# Patient Record
Sex: Female | Born: 1965 | Race: Black or African American | Hispanic: No | Marital: Married | Smoking: Former smoker
Health system: Southern US, Community
[De-identification: ages and names within clinical notes are randomized; demographics above are authoritative.]

## PROBLEM LIST (undated history)

## (undated) DIAGNOSIS — G4730 Sleep apnea, unspecified: Secondary | ICD-10-CM

## (undated) DIAGNOSIS — I214 Non-ST elevation (NSTEMI) myocardial infarction: Secondary | ICD-10-CM

## (undated) DIAGNOSIS — F419 Anxiety disorder, unspecified: Secondary | ICD-10-CM

## (undated) DIAGNOSIS — E7800 Pure hypercholesterolemia, unspecified: Secondary | ICD-10-CM

## (undated) DIAGNOSIS — I2699 Other pulmonary embolism without acute cor pulmonale: Secondary | ICD-10-CM

## (undated) DIAGNOSIS — F32A Depression, unspecified: Secondary | ICD-10-CM

## (undated) DIAGNOSIS — I10 Essential (primary) hypertension: Secondary | ICD-10-CM

## (undated) HISTORY — PX: TOTAL KNEE ARTHROPLASTY: SHX125

## (undated) HISTORY — PX: TUBAL LIGATION: SHX77

## (undated) HISTORY — PX: GASTRIC BYPASS: SHX52

---

## 1999-09-27 DIAGNOSIS — I2699 Other pulmonary embolism without acute cor pulmonale: Secondary | ICD-10-CM

## 1999-09-27 HISTORY — DX: Other pulmonary embolism without acute cor pulmonale: I26.99

## 2004-04-07 ENCOUNTER — Other Ambulatory Visit: Payer: Self-pay

## 2004-09-07 ENCOUNTER — Emergency Department: Payer: Self-pay | Admitting: Emergency Medicine

## 2005-04-26 ENCOUNTER — Emergency Department: Payer: Self-pay | Admitting: Emergency Medicine

## 2005-04-26 ENCOUNTER — Other Ambulatory Visit: Payer: Self-pay

## 2005-05-10 ENCOUNTER — Emergency Department: Payer: Self-pay | Admitting: Emergency Medicine

## 2005-11-20 ENCOUNTER — Emergency Department: Payer: Self-pay | Admitting: Emergency Medicine

## 2006-05-05 ENCOUNTER — Other Ambulatory Visit: Payer: Self-pay

## 2006-05-05 ENCOUNTER — Emergency Department: Payer: Self-pay | Admitting: Emergency Medicine

## 2006-08-02 ENCOUNTER — Emergency Department: Payer: Self-pay | Admitting: Emergency Medicine

## 2006-08-24 ENCOUNTER — Emergency Department: Payer: Self-pay | Admitting: Emergency Medicine

## 2007-05-09 ENCOUNTER — Emergency Department: Payer: Self-pay | Admitting: Emergency Medicine

## 2007-11-19 ENCOUNTER — Emergency Department: Payer: Self-pay | Admitting: Internal Medicine

## 2007-11-29 ENCOUNTER — Emergency Department: Payer: Self-pay | Admitting: Emergency Medicine

## 2008-01-05 ENCOUNTER — Emergency Department: Payer: Self-pay | Admitting: Emergency Medicine

## 2008-01-05 ENCOUNTER — Other Ambulatory Visit: Payer: Self-pay

## 2008-01-07 ENCOUNTER — Emergency Department (HOSPITAL_COMMUNITY): Admission: EM | Admit: 2008-01-07 | Discharge: 2008-01-08 | Payer: Self-pay | Admitting: Emergency Medicine

## 2008-01-26 ENCOUNTER — Emergency Department: Payer: Self-pay | Admitting: Emergency Medicine

## 2008-10-15 ENCOUNTER — Ambulatory Visit: Payer: Self-pay

## 2008-11-18 ENCOUNTER — Ambulatory Visit: Payer: Self-pay

## 2008-12-27 ENCOUNTER — Emergency Department: Payer: Self-pay | Admitting: Emergency Medicine

## 2009-01-02 ENCOUNTER — Emergency Department: Payer: Self-pay | Admitting: Emergency Medicine

## 2009-07-07 ENCOUNTER — Inpatient Hospital Stay: Payer: Self-pay | Admitting: Internal Medicine

## 2009-10-09 ENCOUNTER — Emergency Department: Payer: Self-pay | Admitting: Emergency Medicine

## 2009-10-11 ENCOUNTER — Emergency Department: Payer: Self-pay | Admitting: Emergency Medicine

## 2009-10-13 ENCOUNTER — Emergency Department: Payer: Self-pay | Admitting: Emergency Medicine

## 2010-02-16 ENCOUNTER — Emergency Department: Payer: Self-pay | Admitting: Emergency Medicine

## 2010-02-19 ENCOUNTER — Emergency Department: Payer: Self-pay | Admitting: Emergency Medicine

## 2010-06-09 ENCOUNTER — Emergency Department: Payer: Self-pay | Admitting: Emergency Medicine

## 2010-07-31 ENCOUNTER — Emergency Department: Payer: Self-pay | Admitting: Emergency Medicine

## 2010-08-10 ENCOUNTER — Emergency Department: Payer: Self-pay | Admitting: Emergency Medicine

## 2010-11-23 ENCOUNTER — Emergency Department: Payer: Self-pay

## 2010-12-10 ENCOUNTER — Emergency Department: Payer: Self-pay | Admitting: Emergency Medicine

## 2010-12-19 ENCOUNTER — Emergency Department: Payer: Self-pay | Admitting: Emergency Medicine

## 2011-01-20 ENCOUNTER — Emergency Department: Payer: Self-pay | Admitting: Internal Medicine

## 2011-03-07 ENCOUNTER — Emergency Department: Payer: Self-pay | Admitting: Emergency Medicine

## 2011-06-26 ENCOUNTER — Emergency Department: Payer: Self-pay | Admitting: Internal Medicine

## 2011-11-11 ENCOUNTER — Emergency Department: Payer: Self-pay | Admitting: Emergency Medicine

## 2011-11-11 LAB — RAPID INFLUENZA A&B ANTIGENS

## 2011-11-22 ENCOUNTER — Encounter: Payer: Self-pay | Admitting: *Deleted

## 2011-12-01 ENCOUNTER — Emergency Department: Payer: Self-pay | Admitting: Emergency Medicine

## 2011-12-03 ENCOUNTER — Emergency Department: Payer: Self-pay | Admitting: Emergency Medicine

## 2011-12-04 ENCOUNTER — Emergency Department: Payer: Self-pay | Admitting: Emergency Medicine

## 2012-01-09 ENCOUNTER — Ambulatory Visit: Payer: Self-pay | Admitting: Surgery

## 2012-01-09 LAB — BASIC METABOLIC PANEL
Anion Gap: 9 (ref 7–16)
Calcium, Total: 8.8 mg/dL (ref 8.5–10.1)
Creatinine: 0.92 mg/dL (ref 0.60–1.30)
EGFR (African American): >60
Glucose: 97 mg/dL (ref 65–99)
Osmolality: 283 (ref 275–301)
Potassium: 3.9 mmol/L (ref 3.5–5.1)
Sodium: 141 mmol/L (ref 136–145)

## 2012-01-09 LAB — CBC WITH DIFFERENTIAL/PLATELET
Basophil #: 0 10*3/uL (ref 0.0–0.1)
Eosinophil %: 3.4 %
HGB: 11.2 g/dL — ABNORMAL LOW (ref 12.0–16.0)
MCHC: 32.7 g/dL (ref 32.0–36.0)
MCV: 84 fL (ref 80–100)
Platelet: 273 10*3/uL (ref 150–440)
RDW: 15.7 % — ABNORMAL HIGH (ref 11.5–14.5)
WBC: 7.9 10*3/uL (ref 3.6–11.0)

## 2012-01-09 LAB — PREGNANCY, URINE: Pregnancy Test, Urine: NEGATIVE m[IU]/mL

## 2012-04-23 ENCOUNTER — Emergency Department: Payer: Self-pay | Admitting: Emergency Medicine

## 2012-04-24 ENCOUNTER — Emergency Department: Payer: Self-pay | Admitting: Emergency Medicine

## 2012-10-12 ENCOUNTER — Emergency Department: Payer: Self-pay | Admitting: Emergency Medicine

## 2013-03-31 ENCOUNTER — Emergency Department: Payer: Self-pay | Admitting: Emergency Medicine

## 2013-07-01 ENCOUNTER — Ambulatory Visit: Payer: Self-pay | Admitting: Specialist

## 2013-07-01 LAB — FOLATE: Folic Acid: 10.6 ng/mL (ref 3.1–100.0)

## 2013-07-01 LAB — COMPREHENSIVE METABOLIC PANEL
Albumin: 3.2 g/dL — ABNORMAL LOW (ref 3.4–5.0)
BUN: 15 mg/dL (ref 7–18)
Bilirubin,Total: 0.3 mg/dL (ref 0.2–1.0)
Calcium, Total: 9 mg/dL (ref 8.5–10.1)
Chloride: 101 mmol/L (ref 98–107)
Creatinine: 1.14 mg/dL (ref 0.60–1.30)
EGFR (African American): >60
Glucose: 118 mg/dL — ABNORMAL HIGH (ref 65–99)
Osmolality: 270 (ref 275–301)
SGOT(AST): 19 U/L (ref 15–37)
Sodium: 134 mmol/L — ABNORMAL LOW (ref 136–145)

## 2013-07-01 LAB — CBC WITH DIFFERENTIAL/PLATELET
Basophil #: 0.1 10*3/uL (ref 0.0–0.1)
Basophil %: 0.7 %
Eosinophil %: 2.9 %
HCT: 37.4 % (ref 35.0–47.0)
Lymphocyte #: 3 10*3/uL (ref 1.0–3.6)
Lymphocyte %: 28.8 %
MCH: 27.6 pg (ref 26.0–34.0)
MCHC: 33.7 g/dL (ref 32.0–36.0)
MCV: 82 fL (ref 80–100)
Neutrophil #: 6.7 10*3/uL — ABNORMAL HIGH (ref 1.4–6.5)
Neutrophil %: 64 %
RDW: 15 % — ABNORMAL HIGH (ref 11.5–14.5)

## 2013-07-01 LAB — HEMOGLOBIN A1C: Hemoglobin A1C: 7 % — ABNORMAL HIGH (ref 4.2–6.3)

## 2013-07-01 LAB — PROTIME-INR
INR: 0.9
Prothrombin Time: 12.4 secs (ref 11.5–14.7)

## 2013-07-01 LAB — PHOSPHORUS: Phosphorus: 3.2 mg/dL (ref 2.5–4.9)

## 2013-07-01 LAB — MAGNESIUM: Magnesium: 1.6 mg/dL — ABNORMAL LOW

## 2013-07-01 LAB — APTT: Activated PTT: 31.2 secs (ref 23.6–35.9)

## 2013-07-01 LAB — TSH: Thyroid Stimulating Horm: 1.6 u[IU]/mL

## 2013-07-01 LAB — LIPASE, BLOOD: Lipase: 116 U/L (ref 73–393)

## 2013-07-18 ENCOUNTER — Ambulatory Visit: Payer: Self-pay | Admitting: Specialist

## 2013-07-30 ENCOUNTER — Ambulatory Visit: Payer: Self-pay | Admitting: Cardiology

## 2013-07-31 ENCOUNTER — Ambulatory Visit: Payer: Self-pay | Admitting: Specialist

## 2013-08-29 ENCOUNTER — Emergency Department: Payer: Self-pay | Admitting: Internal Medicine

## 2013-09-25 ENCOUNTER — Emergency Department: Payer: Self-pay | Admitting: Emergency Medicine

## 2013-09-25 LAB — BASIC METABOLIC PANEL
BUN: 15 mg/dL (ref 7–18)
Calcium, Total: 9.4 mg/dL (ref 8.5–10.1)
Chloride: 100 mmol/L (ref 98–107)
Creatinine: 1.14 mg/dL (ref 0.60–1.30)
Osmolality: 269 (ref 275–301)
Sodium: 134 mmol/L — ABNORMAL LOW (ref 136–145)

## 2013-09-25 LAB — URIC ACID: Uric Acid: 7.2 mg/dL — ABNORMAL HIGH (ref 2.6–6.0)

## 2013-09-25 LAB — CBC WITH DIFFERENTIAL/PLATELET
Eosinophil #: 0.2 10*3/uL (ref 0.0–0.7)
Eosinophil %: 2.1 %
Lymphocyte %: 19.6 %
MCH: 27.2 pg (ref 26.0–34.0)
Neutrophil #: 8.2 10*3/uL — ABNORMAL HIGH (ref 1.4–6.5)
Neutrophil %: 73.5 %
Platelet: 342 10*3/uL (ref 150–440)
WBC: 11.2 10*3/uL — ABNORMAL HIGH (ref 3.6–11.0)

## 2013-10-04 ENCOUNTER — Ambulatory Visit: Payer: Self-pay | Admitting: Podiatry

## 2013-11-05 ENCOUNTER — Ambulatory Visit: Payer: Self-pay | Admitting: Specialist

## 2013-12-03 ENCOUNTER — Ambulatory Visit: Payer: Self-pay | Admitting: Specialist

## 2014-03-20 ENCOUNTER — Emergency Department: Payer: Self-pay | Admitting: Emergency Medicine

## 2014-08-04 ENCOUNTER — Emergency Department: Payer: Self-pay | Admitting: Emergency Medicine

## 2014-08-04 LAB — COMPREHENSIVE METABOLIC PANEL
ALK PHOS: 62 U/L
ANION GAP: 6 — AB (ref 7–16)
AST: 12 U/L — AB (ref 15–37)
Albumin: 3 g/dL — ABNORMAL LOW (ref 3.4–5.0)
BUN: 10 mg/dL (ref 7–18)
Bilirubin,Total: 0.4 mg/dL (ref 0.2–1.0)
CALCIUM: 8.5 mg/dL (ref 8.5–10.1)
CHLORIDE: 101 mmol/L (ref 98–107)
CO2: 32 mmol/L (ref 21–32)
Creatinine: 1.17 mg/dL (ref 0.60–1.30)
GFR CALC NON AF AMER: 52 — AB
GLUCOSE: 93 mg/dL (ref 65–99)
Osmolality: 276 (ref 275–301)
POTASSIUM: 3.3 mmol/L — AB (ref 3.5–5.1)
SGPT (ALT): 24 U/L
Sodium: 139 mmol/L (ref 136–145)
Total Protein: 7.4 g/dL (ref 6.4–8.2)

## 2014-08-04 LAB — CBC
HCT: 37.7 % (ref 35.0–47.0)
HGB: 12.5 g/dL (ref 12.0–16.0)
MCH: 27.8 pg (ref 26.0–34.0)
MCHC: 33 g/dL (ref 32.0–36.0)
MCV: 84 fL (ref 80–100)
Platelet: 254 10*3/uL (ref 150–440)
RBC: 4.47 10*6/uL (ref 3.80–5.20)
RDW: 16.2 % — AB (ref 11.5–14.5)
WBC: 9.5 10*3/uL (ref 3.6–11.0)

## 2014-08-04 LAB — TROPONIN I

## 2014-08-08 ENCOUNTER — Emergency Department: Payer: Self-pay | Admitting: Emergency Medicine

## 2014-08-08 LAB — CBC WITH DIFFERENTIAL/PLATELET
BASOS PCT: 0.2 %
Basophil #: 0 10*3/uL (ref 0.0–0.1)
EOS ABS: 0.2 10*3/uL (ref 0.0–0.7)
EOS PCT: 1.9 %
HCT: 41.4 % (ref 35.0–47.0)
HGB: 13.6 g/dL (ref 12.0–16.0)
LYMPHS PCT: 23.8 %
Lymphocyte #: 2.3 10*3/uL (ref 1.0–3.6)
MCH: 27.7 pg (ref 26.0–34.0)
MCHC: 32.8 g/dL (ref 32.0–36.0)
MCV: 84 fL (ref 80–100)
MONO ABS: 0.5 x10 3/mm (ref 0.2–0.9)
Monocyte %: 5.2 %
NEUTROS ABS: 6.8 10*3/uL — AB (ref 1.4–6.5)
Neutrophil %: 68.9 %
PLATELETS: 319 10*3/uL (ref 150–440)
RBC: 4.9 10*6/uL (ref 3.80–5.20)
RDW: 16.3 % — ABNORMAL HIGH (ref 11.5–14.5)
WBC: 9.9 10*3/uL (ref 3.6–11.0)

## 2014-08-08 LAB — COMPREHENSIVE METABOLIC PANEL
ALBUMIN: 3 g/dL — AB (ref 3.4–5.0)
ANION GAP: 10 (ref 7–16)
AST: 21 U/L (ref 15–37)
Alkaline Phosphatase: 78 U/L
BILIRUBIN TOTAL: 0.4 mg/dL (ref 0.2–1.0)
BUN: 8 mg/dL (ref 7–18)
Calcium, Total: 8.1 mg/dL — ABNORMAL LOW (ref 8.5–10.1)
Chloride: 99 mmol/L (ref 98–107)
Co2: 31 mmol/L (ref 21–32)
Creatinine: 1.05 mg/dL (ref 0.60–1.30)
GFR CALC NON AF AMER: 59 — AB
Glucose: 94 mg/dL (ref 65–99)
Osmolality: 277 (ref 275–301)
Potassium: 3.1 mmol/L — ABNORMAL LOW (ref 3.5–5.1)
SGPT (ALT): 22 U/L
SODIUM: 140 mmol/L (ref 136–145)
Total Protein: 8.1 g/dL (ref 6.4–8.2)

## 2014-08-08 LAB — LIPASE, BLOOD: LIPASE: 121 U/L (ref 73–393)

## 2014-08-08 LAB — TROPONIN I: Troponin-I: 0.02 ng/mL

## 2014-08-09 LAB — URINALYSIS, COMPLETE
Bilirubin,UR: NEGATIVE
Glucose,UR: NEGATIVE mg/dL (ref 0–75)
Ketone: NEGATIVE
NITRITE: NEGATIVE
PH: 5 (ref 4.5–8.0)
Protein: NEGATIVE
RBC,UR: 25 /HPF (ref 0–5)
Specific Gravity: 1.014 (ref 1.003–1.030)
WBC UR: 22 /HPF (ref 0–5)

## 2014-11-25 ENCOUNTER — Emergency Department: Payer: Self-pay | Admitting: Emergency Medicine

## 2014-12-11 ENCOUNTER — Emergency Department: Payer: Self-pay | Admitting: Emergency Medicine

## 2015-01-22 ENCOUNTER — Emergency Department
Admit: 2015-01-22 | Disposition: A | Discharge status: Home / Self Care | Payer: Self-pay | Admitting: Emergency Medicine

## 2015-01-22 LAB — COMPREHENSIVE METABOLIC PANEL
ALBUMIN: 3.1 g/dL — AB
ALK PHOS: 43 U/L
ALT: 9 U/L — AB
AST: 12 U/L — AB
Anion Gap: 8 (ref 7–16)
BUN: 15 mg/dL
Bilirubin,Total: 0.4 mg/dL
CALCIUM: 8.1 mg/dL — AB
Chloride: 102 mmol/L
Co2: 28 mmol/L
Creatinine: 1.01 mg/dL — ABNORMAL HIGH
EGFR (Non-African Amer.): >60
GLUCOSE: 83 mg/dL
Potassium: 3.8 mmol/L
Sodium: 138 mmol/L
Total Protein: 6.1 g/dL — ABNORMAL LOW

## 2015-01-22 LAB — CBC
HCT: 36 % (ref 35.0–47.0)
HGB: 11.8 g/dL — ABNORMAL LOW (ref 12.0–16.0)
MCH: 28.6 pg (ref 26.0–34.0)
MCHC: 32.9 g/dL (ref 32.0–36.0)
MCV: 87 fL (ref 80–100)
Platelet: 231 10*3/uL (ref 150–440)
RBC: 4.13 10*6/uL (ref 3.80–5.20)
RDW: 15.5 % — ABNORMAL HIGH (ref 11.5–14.5)
WBC: 7.9 10*3/uL (ref 3.6–11.0)

## 2015-01-22 LAB — TROPONIN I: Troponin-I: <0.03 ng/mL

## 2015-03-17 ENCOUNTER — Other Ambulatory Visit: Payer: Self-pay | Admitting: Family Medicine

## 2015-03-17 DIAGNOSIS — M79605 Pain in left leg: Secondary | ICD-10-CM

## 2015-03-18 ENCOUNTER — Ambulatory Visit
Admission: RE | Admit: 2015-03-18 | Discharge: 2015-03-18 | Disposition: A | Discharge status: Home / Self Care | Payer: Medicare Other | Source: Ambulatory Visit | Attending: Family Medicine | Admitting: Family Medicine

## 2015-03-18 DIAGNOSIS — M79605 Pain in left leg: Secondary | ICD-10-CM | POA: Diagnosis not present

## 2015-03-28 ENCOUNTER — Encounter: Payer: Self-pay | Admitting: Emergency Medicine

## 2015-03-28 ENCOUNTER — Emergency Department: Payer: Medicare Other

## 2015-03-28 ENCOUNTER — Emergency Department
Admission: EM | Admit: 2015-03-28 | Discharge: 2015-03-28 | Disposition: A | Discharge status: Home / Self Care | Payer: Medicare Other | Attending: Emergency Medicine | Admitting: Emergency Medicine

## 2015-03-28 DIAGNOSIS — Y9289 Other specified places as the place of occurrence of the external cause: Secondary | ICD-10-CM | POA: Diagnosis not present

## 2015-03-28 DIAGNOSIS — Y9389 Activity, other specified: Secondary | ICD-10-CM | POA: Diagnosis not present

## 2015-03-28 DIAGNOSIS — S6992XA Unspecified injury of left wrist, hand and finger(s), initial encounter: Secondary | ICD-10-CM | POA: Diagnosis present

## 2015-03-28 DIAGNOSIS — S63615A Unspecified sprain of left ring finger, initial encounter: Secondary | ICD-10-CM | POA: Diagnosis not present

## 2015-03-28 DIAGNOSIS — W1839XA Other fall on same level, initial encounter: Secondary | ICD-10-CM | POA: Diagnosis not present

## 2015-03-28 DIAGNOSIS — Y998 Other external cause status: Secondary | ICD-10-CM | POA: Insufficient documentation

## 2015-03-28 DIAGNOSIS — I10 Essential (primary) hypertension: Secondary | ICD-10-CM | POA: Insufficient documentation

## 2015-03-28 HISTORY — DX: Essential (primary) hypertension: I10

## 2015-03-28 HISTORY — DX: Pure hypercholesterolemia, unspecified: E78.00

## 2015-03-28 HISTORY — DX: Other pulmonary embolism without acute cor pulmonale: I26.99

## 2015-03-28 MED ORDER — IBUPROFEN 800 MG PO TABS: 800.0000 mg | ORAL_TABLET | Freq: Three times a day (TID) | ORAL | Status: DC

## 2015-03-28 MED ORDER — IBUPROFEN 800 MG PO TABS
800.0000 mg | ORAL_TABLET | Freq: Once | ORAL | Status: AC
  Administered 2015-03-28: 800 mg via ORAL

## 2015-03-28 MED ORDER — IBUPROFEN 800 MG PO TABS
ORAL_TABLET | ORAL | Status: AC
  Administered 2015-03-28: 800 mg via ORAL
  Filled 2015-03-28: qty 1

## 2015-03-28 MED ORDER — HYDROCODONE-ACETAMINOPHEN 5-325 MG PO TABS
1.0000 | ORAL_TABLET | ORAL | Status: DC | PRN
Start: 2015-03-28 — End: 2015-06-03

## 2015-03-28 NOTE — Discharge Instructions (Signed)
Finger Sprain A finger sprain happens when the bands of tissue that hold the finger bones together (ligaments) stretch too much and tear. HOME CARE  Keep your injured finger raised (elevated) when possible.  Put ice on the injured area, twice a day, for 2 to 3 days.  Put ice in a plastic bag.  Place a towel between your skin and the bag.  Leave the ice on for 15 minutes.  Only take medicine as told by your doctor.  Do not wear rings on the injured finger.  Protect your finger until pain and stiffness go away (usually 3 to 4 weeks).  Do not get your cast or splint to get wet. Cover your cast or splint with a plastic bag when you shower or bathe. Do not swim.  Your doctor may suggest special exercises for you to do. These exercises will help keep or stop stiffness from happening. GET HELP RIGHT AWAY IF:  Your cast or splint gets damaged.  Your pain gets worse, not better. MAKE SURE YOU:  Understand these instructions.  Will watch your condition.  Will get help right away if you are not doing well or get worse. Document Released: 10/15/2010 Document Revised: 12/05/2011 Document Reviewed: 05/16/2011 [facility] Patient Information 2015 [place], [place]. This information is not intended to replace advice given to you by your health care provider. Make sure you discuss any questions you have with your health care provider.  Cryotherapy Cryotherapy means treatment with cold. Ice or gel packs can be used to reduce both pain and swelling. Ice is the most helpful within the first 24 to 48 hours after an injury or flare-up from overusing a muscle or joint. Sprains, strains, spasms, burning pain, shooting pain, and aches can all be eased with ice. Ice can also be used when recovering from surgery. Ice is effective, has very few side effects, and is safe for most people to use. PRECAUTIONS  Ice is not a safe treatment option for people with:  Raynaud phenomenon. This is a condition  affecting small blood vessels in the extremities. Exposure to cold may cause your problems to return.  Cold hypersensitivity. There are many forms of cold hypersensitivity, including:  Cold urticaria. Red, itchy hives appear on the skin when the tissues begin to warm after being iced.  Cold erythema. This is a red, itchy rash caused by exposure to cold.  Cold hemoglobinuria. Red blood cells break down when the tissues begin to warm after being iced. The hemoglobin that carry oxygen are passed into the urine because they cannot combine with blood proteins fast enough.  Numbness or altered sensitivity in the area being iced. If you have any of the following conditions, do not use ice until you have discussed cryotherapy with your caregiver:  Heart conditions, such as arrhythmia, angina, or chronic heart disease.  High blood pressure.  Healing wounds or open skin in the area being iced.  Current infections.  Rheumatoid arthritis.  Poor circulation.  Diabetes. Ice slows the blood flow in the region it is applied. This is beneficial when trying to stop inflamed tissues from spreading irritating chemicals to surrounding tissues. However, if you expose your skin to cold temperatures for too long or without the proper protection, you can damage your skin or nerves. Watch for signs of skin damage due to cold. HOME CARE INSTRUCTIONS Follow these tips to use ice and cold packs safely.  Place a dry or damp towel between the ice and skin. A damp towel  will cool the skin more quickly, so you may need to shorten the time that the ice is used.  For a more rapid response, add gentle compression to the ice.  Ice for no more than 10 to 20 minutes at a time. The bonier the area you are icing, the less time it will take to get the benefits of ice.  Check your skin after 5 minutes to make sure there are no signs of a poor response to cold or skin damage.  Rest 20 minutes or more between uses.  Once  your skin is numb, you can end your treatment. You can test numbness by very lightly touching your skin. The touch should be so light that you do not see the skin dimple from the pressure of your fingertip. When using ice, most people will feel these normal sensations in this order: cold, burning, aching, and numbness.  Do not use ice on someone who cannot communicate their responses to pain, such as small children or people with dementia. HOW TO MAKE AN ICE PACK Ice packs are the most common way to use ice therapy. Other methods include ice massage, ice baths, and cryosprays. Muscle creams that cause a cold, tingly feeling do not offer the same benefits that ice offers and should not be used as a substitute unless recommended by your caregiver. To make an ice pack, do one of the following:  Place crushed ice or a bag of frozen vegetables in a sealable plastic bag. Squeeze out the excess air. Place this bag inside another plastic bag. Slide the bag into a pillowcase or place a damp towel between your skin and the bag.  Mix 3 parts water with 1 part rubbing alcohol. Freeze the mixture in a sealable plastic bag. When you remove the mixture from the freezer, it will be slushy. Squeeze out the excess air. Place this bag inside another plastic bag. Slide the bag into a pillowcase or place a damp towel between your skin and the bag. SEEK MEDICAL CARE IF:  You develop white spots on your skin. This may give the skin a blotchy (mottled) appearance.  Your skin turns blue or pale.  Your skin becomes waxy or hard.  Your swelling gets worse. MAKE SURE YOU:   Understand these instructions.  Will watch your condition.  Will get help right away if you are not doing well or get worse. Document Released: 05/09/2011 Document Revised: 01/27/2014 Document Reviewed: 05/09/2011 [facility] Patient Information 2015 [place]. This information is not intended to replace advice given to you by your health care  provider. Make sure you discuss any questions you have with your health care provider.

## 2015-03-28 NOTE — ED Notes (Signed)
Pt left prior to receiving DC instructions.

## 2015-03-28 NOTE — ED Notes (Signed)
Pt returned call, states she did not know she had D/C paperwork, told patient to return to get prescriptions and sign D/C paperwork.

## 2015-03-28 NOTE — ED Provider Notes (Signed)
[facility] Emergency Department Provider Note  ____________________________________________  Time seen: 1340  I have reviewed the triage vital signs and the nursing notes.   HISTORY  Chief Complaint Hand Pain   HPI Erika Knapp is a 49 y.o. female states that she fell while getting out of the bathtub last night. She is continued to have pain in her left fourth finger. She took some over-the-counter pain medication last evening with minimal results. It continues to swell today.Pain is constant, increased with movement. Nothing she has done seems to make a difference. Currently her pain is 10 over 10.   Past Medical History  Diagnosis Date  . Hypertension   . High cholesterol   . Pulmonary embolism     There are no active problems to display for this patient.   Past Surgical History  Procedure Laterality Date  . Total knee arthroplasty    . Gastric bypass      Current Outpatient Rx  Name  Route  Sig  Dispense  Refill  . HYDROcodone-acetaminophen (NORCO/VICODIN) 5-325 MG per tablet   Oral   Take 1 tablet by mouth every 4 (four) hours as needed for moderate pain.   20 tablet   0   . ibuprofen (ADVIL,MOTRIN) 800 MG tablet   Oral   Take 1 tablet (800 mg total) by mouth 3 (three) times daily.   30 tablet   0     Allergies Tramadol  No family history on file.  Social History History  Substance Use Topics  . Smoking status: Not on file  . Smokeless tobacco: Not on file  . Alcohol Use: Not on file    Review of Systems Constitutional: No fever/chills Eyes: No visual changes. ENT: No sore throat. Cardiovascular: Denies chest pain. Respiratory: Denies shortness of breath. Gastrointestinal: No abdominal pain.  No nausea, no vomiting.  Genitourinary: Negative for dysuria. Musculoskeletal: Negative for back pain. Skin: Negative for rash. Neurological: Negative for headaches, focal weakness or numbness.  10-point ROS otherwise  negative.  ____________________________________________   PHYSICAL EXAM:  VITAL SIGNS: ED Triage Vitals  Enc Vitals Group     BP 03/28/15 1127 122/82 mmHg     Pulse Rate 03/28/15 1127 74     Resp 03/28/15 1127 20     Temp 03/28/15 1127 98 F (36.7 C)     Temp Source 03/28/15 1127 Oral     SpO2 03/28/15 1127 100 %     Weight 03/28/15 1127 253 lb (114.76 kg)     Height 03/28/15 1127  (1.676 m)     Head Cir --      Peak Flow --      Pain Score 03/28/15 1129 10     Pain Loc --      Pain Edu? --      Excl. in GC? --     Constitutional: Alert and oriented. Well appearing and in no acute distress. Eyes: Conjunctivae are normal. PERRL. EOMI. Head: Atraumatic. Nose: No congestion/rhinnorhea. Neck: No stridor.   Cardiovascular: Normal rate, regular rhythm. Grossly normal heart sounds.  Good peripheral circulation. Respiratory: Normal respiratory effort.  No retractions. Lungs CTAB. Gastrointestinal: Soft and nontender. No distention Musculoskeletal: No lower extremity tenderness nor edema.  No joint effusions. Left fourth finger with moderate edema at the PIP joint. There is moderate tenderness on palpation of that area. Range of motions restricted secondary to pain. Neurologic:  Normal speech and language. No gross focal neurologic deficits are appreciated. Speech  is normal. No gait instability. Skin:  Skin is warm, dry and intact. No rash noted. No abrasion noted on the left fourth finger Psychiatric: Mood and affect are normal. Speech and behavior are normal.  ____________________________________________   LABS (all labs ordered are listed, but only abnormal results are displayed)  Labs Reviewed - No data to display    RADIOLOGY  X-ray left fourth finger shows degenerative changes secondary to severe osteoarthritis. I, Tommi Rumpshonda L Summers, personally viewed and evaluated these images as part of my medical decision making.   ____________________________________________   PROCEDURES  Procedure(s) performed: None  Critical Care performed: No  ____________________________________________   INITIAL IMPRESSION / ASSESSMENT AND PLAN / ED COURSE  Pertinent labs & imaging results that were available during my care of the patient were reviewed by me and considered in my medical decision making (see chart for details diagnoses  Patient finger was buddy taped and a metal splint placed. Patient was given ibuprofen and Norco for pain. She is to follow-up with [facility] if any continued problems.   FINAL CLINICAL IMPRESSION(S) / ED DIAGNOSES  Final diagnoses:  Sprain of fourth finger of left hand, initial encounter      Tommi RumpsRhonda L Summers, PA-C 03/28/15 1259  Jene Everyobert Kinner, MD 03/28/15 [phone]

## 2015-03-28 NOTE — ED Notes (Signed)
Patient presents to the ED with painful fourth finger on her left hand.  Patient states she fell while attempting to get out of the bathtub.  Finger appears slightly deformed.  Patient is in no obvious distress at this time.

## 2015-03-28 NOTE — ED Notes (Signed)
Attempted to call patient. Asked person who answered pt's phone to have [facility].

## 2015-03-31 ENCOUNTER — Ambulatory Visit
Admission: RE | Admit: 2015-03-31 | Discharge: 2015-03-31 | Disposition: A | Discharge status: Home / Self Care | Payer: Medicare Other | Source: Ambulatory Visit | Attending: Family Medicine | Admitting: Family Medicine

## 2015-03-31 ENCOUNTER — Other Ambulatory Visit: Payer: Self-pay | Admitting: Family Medicine

## 2015-03-31 DIAGNOSIS — Z1231 Encounter for screening mammogram for malignant neoplasm of breast: Secondary | ICD-10-CM

## 2015-03-31 DIAGNOSIS — N6489 Other specified disorders of breast: Secondary | ICD-10-CM | POA: Diagnosis not present

## 2015-03-31 DIAGNOSIS — Z1239 Encounter for other screening for malignant neoplasm of breast: Secondary | ICD-10-CM

## 2015-03-31 DIAGNOSIS — Z139 Encounter for screening, unspecified: Secondary | ICD-10-CM

## 2015-04-02 ENCOUNTER — Other Ambulatory Visit: Payer: Self-pay | Admitting: Family Medicine

## 2015-04-02 DIAGNOSIS — R928 Other abnormal and inconclusive findings on diagnostic imaging of breast: Secondary | ICD-10-CM

## 2015-04-06 ENCOUNTER — Other Ambulatory Visit: Payer: Self-pay

## 2015-04-06 ENCOUNTER — Encounter: Payer: Self-pay | Admitting: *Deleted

## 2015-04-06 ENCOUNTER — Emergency Department
Admission: EM | Admit: 2015-04-06 | Discharge: 2015-04-06 | Disposition: A | Discharge status: Home / Self Care | Payer: Medicare Other | Attending: Emergency Medicine | Admitting: Emergency Medicine

## 2015-04-06 ENCOUNTER — Emergency Department: Payer: Medicare Other

## 2015-04-06 DIAGNOSIS — I10 Essential (primary) hypertension: Secondary | ICD-10-CM | POA: Insufficient documentation

## 2015-04-06 DIAGNOSIS — R569 Unspecified convulsions: Secondary | ICD-10-CM | POA: Insufficient documentation

## 2015-04-06 DIAGNOSIS — Z791 Long term (current) use of non-steroidal anti-inflammatories (NSAID): Secondary | ICD-10-CM | POA: Diagnosis not present

## 2015-04-06 DIAGNOSIS — R51 Headache: Secondary | ICD-10-CM | POA: Diagnosis not present

## 2015-04-06 DIAGNOSIS — F1210 Cannabis abuse, uncomplicated: Secondary | ICD-10-CM | POA: Diagnosis not present

## 2015-04-06 LAB — COMPREHENSIVE METABOLIC PANEL
ALT: 10 U/L — AB (ref 14–54)
ANION GAP: 7 (ref 5–15)
AST: 21 U/L (ref 15–41)
Albumin: 3.4 g/dL — ABNORMAL LOW (ref 3.5–5.0)
Alkaline Phosphatase: 49 U/L (ref 38–126)
BUN: 18 mg/dL (ref 6–20)
CALCIUM: 9.1 mg/dL (ref 8.9–10.3)
CHLORIDE: 104 mmol/L (ref 101–111)
CO2: 29 mmol/L (ref 22–32)
CREATININE: 1.1 mg/dL — AB (ref 0.44–1.00)
GFR calc Af Amer: >60 mL/min (ref >60)
GFR calc non Af Amer: 58 mL/min — ABNORMAL LOW (ref >60)
GLUCOSE: 91 mg/dL (ref 65–99)
Potassium: 4.2 mmol/L (ref 3.5–5.1)
Sodium: 140 mmol/L (ref 135–145)
Total Bilirubin: 0.7 mg/dL (ref 0.3–1.2)
Total Protein: 6.6 g/dL (ref 6.5–8.1)

## 2015-04-06 LAB — CBC WITH DIFFERENTIAL/PLATELET
Basophils Absolute: 0 10*3/uL (ref 0–0.1)
Basophils Relative: 0 %
Eosinophils Absolute: 0.2 10*3/uL (ref 0–0.7)
Eosinophils Relative: 3 %
HCT: 35.7 % (ref 35.0–47.0)
HEMOGLOBIN: 11.6 g/dL — AB (ref 12.0–16.0)
LYMPHS PCT: 26 %
Lymphs Abs: 1.6 10*3/uL (ref 1.0–3.6)
MCH: 28.4 pg (ref 26.0–34.0)
MCHC: 32.5 g/dL (ref 32.0–36.0)
MCV: 87.3 fL (ref 80.0–100.0)
MONO ABS: 0.4 10*3/uL (ref 0.2–0.9)
Monocytes Relative: 7 %
Neutro Abs: 4 10*3/uL (ref 1.4–6.5)
Neutrophils Relative %: 64 %
Platelets: 238 10*3/uL (ref 150–440)
RBC: 4.09 MIL/uL (ref 3.80–5.20)
RDW: 15.1 % — ABNORMAL HIGH (ref 11.5–14.5)
WBC: 6.2 10*3/uL (ref 3.6–11.0)

## 2015-04-06 LAB — URINALYSIS COMPLETE WITH MICROSCOPIC (ARMC ONLY)
BACTERIA UA: NONE SEEN
Bilirubin Urine: NEGATIVE
GLUCOSE, UA: NEGATIVE mg/dL
Hgb urine dipstick: NEGATIVE
Leukocytes, UA: NEGATIVE
Nitrite: NEGATIVE
PROTEIN: NEGATIVE mg/dL
SPECIFIC GRAVITY, URINE: 1.01 (ref 1.005–1.030)
pH: 6 (ref 5.0–8.0)

## 2015-04-06 LAB — URINE DRUG SCREEN, QUALITATIVE (ARMC ONLY)
AMPHETAMINES, UR SCREEN: NOT DETECTED
BARBITURATES, UR SCREEN: NOT DETECTED
Benzodiazepine, Ur Scrn: NOT DETECTED
CANNABINOID 50 NG, UR ~~LOC~~: POSITIVE — AB
Cocaine Metabolite,Ur ~~LOC~~: NOT DETECTED
MDMA (Ecstasy)Ur Screen: NOT DETECTED
METHADONE SCREEN, URINE: NOT DETECTED
OPIATE, UR SCREEN: NOT DETECTED
Phencyclidine (PCP) Ur S: NOT DETECTED
TRICYCLIC, UR SCREEN: NOT DETECTED

## 2015-04-06 MED ORDER — ACETAMINOPHEN 500 MG PO TABS
1000.0000 mg | ORAL_TABLET | Freq: Once | ORAL | Status: AC
  Administered 2015-04-06: 1000 mg via ORAL
  Filled 2015-04-06: qty 2

## 2015-04-06 NOTE — ED Notes (Addendum)
Pt arrives via EMS from home, pt was actively seizing upon EMS arrival and then posttictal, pt denies any memory of the seizure, pt states a 10/10 headache upon arrival, pt awake and alert upon arrival, pt has hx of migranes and was started on migrane medication Sumatriptan 2 months ago

## 2015-04-06 NOTE — ED Provider Notes (Signed)
[facility] Emergency Department Provider Note  ____________________________________________  Time seen: 1320  I have reviewed the triage vital signs and the nursing notes.   HISTORY  Chief Complaint Seizures   History limited by: Not Limited   HPI Erika Knapp is a 49 y.o. female who presents to the emergency department via EMS after seizure. Family member states that she was with the patient when she walked in the room and found the patient on the floor. The patient was jerking all 4 extremities. EMS states that when they arrived the patient was seizing and then was slightly postictal afterwards. Patient does not have a history of seizures. Patient denies any alcohol or drug use. Patient is a does have occasional fainting in the past. Currently only complaining of a slight headache.   Past Medical History  Diagnosis Date  . Hypertension   . High cholesterol   . Pulmonary embolism     There are no active problems to display for this patient.   Past Surgical History  Procedure Laterality Date  . Total knee arthroplasty    . Gastric bypass      Current Outpatient Rx  Name  Route  Sig  Dispense  Refill  . HYDROcodone-acetaminophen (NORCO/VICODIN) 5-325 MG per tablet   Oral   Take 1 tablet by mouth every 4 (four) hours as needed for moderate pain.   20 tablet   0   . ibuprofen (ADVIL,MOTRIN) 800 MG tablet   Oral   Take 1 tablet (800 mg total) by mouth 3 (three) times daily.   30 tablet   0     Allergies Tramadol  History reviewed. No pertinent family history.  Social History History  Substance Use Topics  . Smoking status: Not on file  . Smokeless tobacco: Not on file  . Alcohol Use: Not on file    Review of Systems  Constitutional: Negative for fever. Cardiovascular: Negative for chest pain. Respiratory: Negative for shortness of breath. Gastrointestinal: Negative for abdominal pain, vomiting and  diarrhea. Genitourinary: Negative for dysuria. Musculoskeletal: Negative for back pain. Skin: Negative for rash. Neurological: Headache   10-point ROS otherwise negative.  ____________________________________________   PHYSICAL EXAM:  VITAL SIGNS: ED Triage Vitals  Enc Vitals Group     BP 04/06/15 1237 131/90 mmHg     Pulse Rate 04/06/15 1237 69     Resp 04/06/15 1237 20     Temp 04/06/15 1237 97.8 F (36.6 C)     Temp Source 04/06/15 1237 Oral     SpO2 04/06/15 1237 100 %     Weight 04/06/15 1237 263 lb (119.296 kg)     Height 04/06/15 1237  (1.676 m)     Head Cir --      Peak Flow --      Pain Score 04/06/15 1238 10   Constitutional: Alert and oriented. Well appearing and in no distress. Eyes: Conjunctivae are normal. PERRL. Normal extraocular movements. ENT   Head: Normocephalic and atraumatic.   Nose: No congestion/rhinnorhea.   Mouth/Throat: Mucous membranes are moist.   Neck: No stridor. Hematological/Lymphatic/Immunilogical: No cervical lymphadenopathy. Cardiovascular: Normal rate, regular rhythm.  No murmurs, rubs, or gallops. Respiratory: Normal respiratory effort without tachypnea nor retractions. Breath sounds are clear and equal bilaterally. No wheezes/rales/rhonchi. Gastrointestinal: Soft and nontender. No distention. There is no CVA tenderness. Genitourinary: Deferred Musculoskeletal: Normal range of motion in all extremities. No joint effusions.  No lower extremity tenderness nor edema. Neurologic:  Normal speech  and language. Strength 5 out of 5 in upper and lower extremities. Sensation intact. No gross focal neurologic deficits are appreciated. Speech is normal.  Skin:  Skin is warm, dry and intact. No rash noted. Psychiatric: Mood and affect are normal. Speech and behavior are normal. Patient exhibits appropriate insight and judgment.  ____________________________________________    LABS (pertinent positives/negatives)  Labs  Reviewed  COMPREHENSIVE METABOLIC PANEL - Abnormal; Notable for the following:    Creatinine, Ser 1.10 (*)    Albumin 3.4 (*)    ALT 10 (*)    GFR calc non Af Amer 58 (*)    All other components within normal limits  CBC WITH DIFFERENTIAL/PLATELET - Abnormal; Notable for the following:    Hemoglobin 11.6 (*)    RDW 15.1 (*)    All other components within normal limits  URINALYSIS COMPLETEWITH MICROSCOPIC ([facility] ONLY) - Abnormal; Notable for the following:    Color, Urine STRAW (*)    APPearance CLEAR (*)    Ketones, ur TRACE (*)    Squamous Epithelial / LPF 0-5 (*)    All other components within normal limits  URINE DRUG SCREEN, QUALITATIVE ([facility] ONLY) - Abnormal; Notable for the following:    Cannabinoid 50 Ng, Ur [place] POSITIVE (*)    All other components within normal limits     ____________________________________________   EKG  I, Phineas SemenGraydon Zori Benbrook, attending physician, personally viewed and interpreted this EKG  EKG Time: 1237 Rate: 67 Rhythm: normal sinus rhythm Axis: normal Intervals: normal QRS: normal ST changes: no st elevation     ____________________________________________    RADIOLOGY  CT head  IMPRESSION: Normal head CT. ____________________________________________   PROCEDURES  Procedure(s) performed: None  Critical Care performed: No  ____________________________________________   INITIAL IMPRESSION / ASSESSMENT AND PLAN / ED COURSE  Pertinent labs & imaging results that were available during my care of the patient were reviewed by me and considered in my medical decision making (see chart for details).  Patient presents after a seizure like episode. No history of epilepsy. CT head and workup without any obvious etiology. Did discuss with Dr. Loretha BrasilZeylikman at this point will not start antiepileptics. I did discuss with patient seizure precautions including no driving or other dangerous backache  activities.  ____________________________________________   FINAL CLINICAL IMPRESSION(S) / ED DIAGNOSES  Final diagnoses:  Seizure-like activity     Phineas SemenGraydon Nawal Burling, MD 04/06/15 1520

## 2015-04-06 NOTE — ED Notes (Signed)
Patient transported to CT 

## 2015-04-06 NOTE — Discharge Instructions (Signed)
As we discussed it is very important that you do not drive, go up on roofs, swim, or put herself in any situation that might be dangerous for your others if you're to have another seizure until you are cleared by a neurologist. Please seek medical attention for any high fevers, chest pain, shortness of breath, change in behavior, persistent vomiting, bloody stool or any other new or concerning symptoms.  Epilepsy Epilepsy is a disorder in which a person has repeated seizures over time. A seizure is a release of abnormal electrical activity in the brain. Seizures can cause a change in attention, behavior, or the ability to remain awake and alert (altered mental status). Seizures often involve uncontrollable shaking (convulsions).  Most people with epilepsy lead normal lives. However, people with epilepsy are at an increased risk of falls, accidents, and injuries. Therefore, it is important to begin treatment right away. CAUSES  Epilepsy has many possible causes. Anything that disturbs the normal pattern of brain cell activity can lead to seizures. This may include:   Head injury.  Birth trauma.  High fever as a child.  Stroke.  Bleeding into or around the brain.  Certain drugs.  Prolonged low oxygen, such as what occurs after CPR efforts.  Abnormal brain development.  Certain illnesses, such as meningitis, encephalitis (brain infection), malaria, and other infections.  An imbalance of nerve signaling chemicals (neurotransmitters).  SIGNS AND SYMPTOMS  The symptoms of a seizure can vary greatly from one person to another. Right before a seizure, you may have a warning (aura) that a seizure is about to occur. An aura may include the following symptoms:  Fear or anxiety.  Nausea.  Feeling like the room is spinning (vertigo).  Vision changes, such as seeing flashing lights or spots. Common symptoms during a seizure include:  Abnormal sensations, such as an abnormal smell or a  bitter taste in the mouth.   Sudden, general body stiffness.   Convulsions that involve rhythmic jerking of the face, arm, or leg on one or both sides.   Sudden change in consciousness.   Appearing to be awake but not responding.   Appearing to be asleep but cannot be awakened.   Grimacing, chewing, lip smacking, drooling, tongue biting, or loss of bowel or bladder control. After a seizure, you may feel sleepy for a while. DIAGNOSIS  Your health care provider will ask about your symptoms and take a medical history. Descriptions from any witnesses to your seizures will be very helpful in the diagnosis. A physical exam, including a detailed neurological exam, is necessary. Various tests may be done, such as:   An electroencephalogram (EEG). This is a painless test of your brain waves. In this test, a diagram is created of your brain waves. These diagrams can be interpreted by a specialist.  An MRI of the brain.   A CT scan of the brain.   A spinal tap (lumbar puncture, LP).  Blood tests to check for signs of infection or abnormal blood chemistry. TREATMENT  There is no cure for epilepsy, but it is generally treatable. Once epilepsy is diagnosed, it is important to begin treatment as soon as possible. For most people with epilepsy, seizures can be controlled with medicines. The following may also be used:  A pacemaker for the brain (vagus nerve stimulator) can be used for people with seizures that are not well controlled by medicine.  Surgery on the brain. For some people, epilepsy eventually goes away. HOME CARE INSTRUCTIONS  Follow your health care provider's recommendations on driving and safety in normal activities.  Get enough rest. Lack of sleep can cause seizures.  Only take over-the-counter or prescription medicines as directed by your health care provider. Take any prescribed medicine exactly as directed.  Avoid any known triggers of your seizures.  Keep a  seizure diary. Record what you recall about any seizure, especially any possible trigger.   Make sure the people you live and work with know that you are prone to seizures. They should receive instructions on how to help you. In general, a witness to a seizure should:   Cushion your head and body.   Turn you on your side.   Avoid unnecessarily restraining you.   Not place anything inside your mouth.   Call for emergency medical help if there is any question about what has occurred.   Follow up with your health care provider as directed. You may need regular blood tests to monitor the levels of your medicine.  SEEK MEDICAL CARE IF:   You develop signs of infection or other illness. This might increase the risk of a seizure.   You seem to be having more frequent seizures.   Your seizure pattern is changing.  SEEK IMMEDIATE MEDICAL CARE IF:   You have a seizure that does not stop after a few moments.   You have a seizure that causes any difficulty in breathing.   You have a seizure that results in a very severe headache.   You have a seizure that leaves you with the inability to speak or use a part of your body.  Document Released: 09/12/2005 Document Revised: 07/03/2013 Document Reviewed: 04/24/2013 [facility] Patient Information 2015 [place], [place]. This information is not intended to replace advice given to you by your health care provider. Make sure you discuss any questions you have with your health care provider.

## 2015-04-06 NOTE — ED Notes (Signed)
Side rails padded, seizure precautions initated

## 2015-04-07 ENCOUNTER — Other Ambulatory Visit: Payer: Medicare Other

## 2015-04-07 ENCOUNTER — Ambulatory Visit: Admission: RE | Admit: 2015-04-07 | Payer: Medicare Other | Source: Ambulatory Visit

## 2015-04-09 ENCOUNTER — Ambulatory Visit (INDEPENDENT_AMBULATORY_CARE_PROVIDER_SITE_OTHER): Payer: Medicare Other | Admitting: Neurology

## 2015-04-09 ENCOUNTER — Encounter: Payer: Self-pay | Admitting: Neurology

## 2015-04-09 VITALS — BP 102/70 | HR 67 | Ht 66.0 in | Wt 260.0 lb

## 2015-04-09 DIAGNOSIS — R569 Unspecified convulsions: Secondary | ICD-10-CM | POA: Diagnosis not present

## 2015-04-09 NOTE — Progress Notes (Signed)
PATIENT: Erika Knapp DOB: [birth year]  Chief Complaint  Patient presents with  . Seizures    RM 5  New Patient     HISTORICAL  Erika Knapp is a 49 years old right-handed female, seen in refer by her primary care physician Dr. Greggory StallionGeorge for seizure, she is accompanied by her daughter at today's clinical visit  She had a history of hypertension, hyperlipidemia, pulmonary emboli in 2001, also had a history of knee replacement, gastric bypass in July 2015, successfully lost 200 pounds.  She denies a history of seizure, had one seizure in April 06 2015  She had a headache the night before, took Imitrex, went to sleep, next morning, with out warning signs, was witnessed by her daughter having seizure on the floor, paramedic was called, she was taken to the emergency room  I reviewed emergency room note in April 06 2015, personally reviewed CAT scan of the brain that was normal, CMP showed mild elevated creatinine 1.1, mild anemia hemoglobin 11.7  She is now back to baseline, complains of excessive stress at home, but denies visual loss, denies lateralized motor or sensory deficit.   REVIEW OF SYSTEMS: Full 14 system review of systems performed and notable only for depression, anxiety, hallucinations  ALLERGIES: Allergies  Allergen Reactions  . Tramadol Hives    Other reaction(s): HIVES    HOME MEDICATIONS: Current Outpatient Prescriptions  Medication Sig Dispense Refill  . buPROPion (WELLBUTRIN XL) 300 MG 24 hr tablet Take 300 mg by mouth daily.  0  . Calcium Carb-Cholecalciferol (CALCIUM + D3) 600-200 MG-UNIT TABS Take 1 tablet by mouth daily.    Marland Kitchen. gabapentin (NEURONTIN) 300 MG capsule Take 1 capsule by mouth 3 (three) times daily.  1  . HYDROcodone-acetaminophen (NORCO/VICODIN) 5-325 MG per tablet Take 1 tablet by mouth every 4 (four) hours as needed for moderate pain. 20 tablet 0  . ibuprofen (ADVIL,MOTRIN) 800 MG tablet Take 1 tablet (800 mg total) by mouth 3 (three)  times daily. 30 tablet 0  . LATUDA 120 MG TABS Take 1 tablet by mouth at bedtime.  0  . lovastatin (MEVACOR) 20 MG tablet Take 1 tablet by mouth daily.  0  . Multiple Vitamins-Minerals (MULTIVITAMIN WITH MINERALS) tablet Take 1 tablet by mouth daily.    . naproxen (NAPROSYN) 500 MG tablet Take 500 mg by mouth 2 (two) times daily with a meal.  0  . SUMAtriptan (IMITREX) 25 MG tablet Take 1 tablet by mouth daily.  0  . SUMAtriptan (IMITREX) 50 MG tablet Take 1 tablet by mouth daily.  1  . topiramate (TOPAMAX) 25 MG tablet Take 1 tablet by mouth 2 (two) times daily.  0  . triamterene-hydrochlorothiazide (MAXZIDE-25) 37.5-25 MG per tablet Take 1 tablet by mouth daily.  0   No current facility-administered medications for this visit.    PAST MEDICAL HISTORY: Past Medical History  Diagnosis Date  . Hypertension   . High cholesterol   . Pulmonary embolism     PAST SURGICAL HISTORY: Past Surgical History  Procedure Laterality Date  . Total knee arthroplasty    . Gastric bypass      FAMILY HISTORY: Family History  Problem Relation Age of Onset  . High blood pressure Brother   . High blood pressure Sister   . Depression Brother   . Depression Sister   . Bipolar disorder Brother     SOCIAL HISTORY:  History   Social History  . Marital Status: Single  Spouse Name: N/A  . Number of Children: 2  . Years of Education: 69 th   Occupational History    Disabled   Social History Main Topics  . Smoking status: Current Every Day Smoker  . Smokeless tobacco: Never Used  . Alcohol Use: 0.0 oz/week    0 Standard drinks or equivalent per week  . Drug Use: Yes    Special: Marijuana  . Sexual Activity: Not on file   Other Topics Concern  . Not on file   Social History Narrative   Patient lives at home alone and her daughter lives with her.   Disabled.   Education 11 th grade.   Right handed.   Caffeine Diet mountain dew one daily.     PHYSICAL EXAM   Filed Vitals:    04/09/15 1629  BP: 102/70  Pulse: 67  Height:  (1.676 m)  Weight: 260 lb (117.935 kg)    Not recorded      Body mass index is 41.99 kg/(m^2).  PHYSICAL EXAMNIATION:  Gen: NAD, conversant, well nourised, obese, well groomed                     Cardiovascular: Regular rate rhythm, no peripheral edema, warm, nontender. Eyes: Conjunctivae clear without exudates or hemorrhage Neck: Supple, no carotid bruise. Pulmonary: Clear to auscultation bilaterally   NEUROLOGICAL EXAM:  MENTAL STATUS:Depressed looking middle-age female   Speech:    Speech is normal; fluent and spontaneous with normal comprehension.  Cognition:    The patient is oriented to person, place, and time;     recent and remote memory intact;     language fluent;     normal attention, concentration,     fund of knowledge.  CRANIAL NERVES: CN II: Visual fields are full to confrontation. Fundoscopic exam is normal with sharp discs and no vascular changes. pupil were equal round reactive to light  CN III, IV, VI: extraocular movement are normal. No ptosis. CN V: Facial sensation is intact to pinprick in all 3 divisions bilaterally. Corneal responses are intact.  CN VII: Face is symmetric with normal eye closure and smile. CN VIII: Hearing is normal to rubbing fingers CN IX, X: Palate elevates symmetrically. Phonation is normal. CN XI: Head turning and shoulder shrug are intact CN XII: Tongue is midline with normal movements and no atrophy.  MOTOR: There is no pronator drift of out-stretched arms. Muscle bulk and tone are normal. Muscle strength is normal.  REFLEXES: Reflexes are 2+ and symmetric at the biceps, triceps, knees, and ankles. Plantar responses are flexor.  SENSORY: Light touch, pinprick, position sense, and vibration sense are intact in fingers and toes.  COORDINATION: Rapid alternating movements and fine finger movements are intact. There is no dysmetria on finger-to-nose and heel-knee-shin.  There are no abnormal or extraneous movements.   GAIT/STANCE: Posture is normal. Gait is steady with normal steps, base, arm swing, and turning. Heel and toe walking are normal. Tandem gait is normal.  Romberg is absent.   DIAGNOSTIC DATA (LABS, IMAGING, TESTING) - I reviewed patient records, labs, notes, testing and imaging myself where available.  Lab Results  Component Value Date   WBC 6.2 04/06/2015   HGB 11.6* 04/06/2015   HCT 35.7 04/06/2015   MCV 87.3 04/06/2015   PLT 238 04/06/2015      Component Value Date/Time   NA 140 04/06/2015 1243   NA 138 01/22/2015 1706   K 4.2 04/06/2015 1243   K  3.8 01/22/2015 1706   CL 104 04/06/2015 1243   CL 102 01/22/2015 1706   CO2 29 04/06/2015 1243   CO2 28 01/22/2015 1706   GLUCOSE 91 04/06/2015 1243   GLUCOSE 83 01/22/2015 1706   BUN 18 04/06/2015 1243   BUN 15 01/22/2015 1706   CREATININE 1.10* 04/06/2015 1243   CREATININE 1.01* 01/22/2015 1706   CALCIUM 9.1 04/06/2015 1243   CALCIUM 8.1* 01/22/2015 1706   PROT 6.6 04/06/2015 1243   PROT 6.1* 01/22/2015 1706   ALBUMIN 3.4* 04/06/2015 1243   ALBUMIN 3.1* 01/22/2015 1706   AST 21 04/06/2015 1243   AST 12* 01/22/2015 1706   ALT 10* 04/06/2015 1243   ALT 9* 01/22/2015 1706   ALKPHOS 49 04/06/2015 1243   ALKPHOS 43 01/22/2015 1706   BILITOT 0.7 04/06/2015 1243   GFRNONAA 58* 04/06/2015 1243   GFRNONAA >60 01/22/2015 1706   GFRAA >60 04/06/2015 1243   GFRAA >60 01/22/2015 1706   ASSESSMENT AND PLAN  Erika Knapp is a 49 y.o. female   with new onset generalized seizure in April 06 2015   1 complete evaluation with MRI of the brain with without contrast  2 EEG 3 no driving until seizure free in 6 months 4 return to clinic in 1-2 months, call clinic for recurrent seizure   Levert Feinstein, M.D. Ph.D.  [facility] [address] [place], [place] [address] Ph: [phone] Fax: [phone]

## 2015-04-10 ENCOUNTER — Ambulatory Visit
Admission: RE | Admit: 2015-04-10 | Discharge: 2015-04-10 | Disposition: A | Discharge status: Home / Self Care | Payer: Medicare Other | Source: Ambulatory Visit | Attending: Family Medicine | Admitting: Family Medicine

## 2015-04-10 ENCOUNTER — Ambulatory Visit: Payer: Medicare Other

## 2015-04-10 DIAGNOSIS — R569 Unspecified convulsions: Secondary | ICD-10-CM | POA: Insufficient documentation

## 2015-04-10 DIAGNOSIS — N6001 Solitary cyst of right breast: Secondary | ICD-10-CM | POA: Insufficient documentation

## 2015-04-10 DIAGNOSIS — R928 Other abnormal and inconclusive findings on diagnostic imaging of breast: Secondary | ICD-10-CM

## 2015-04-15 ENCOUNTER — Other Ambulatory Visit: Payer: Medicare Other

## 2015-04-20 ENCOUNTER — Telehealth: Payer: Self-pay | Admitting: Neurology

## 2015-04-20 NOTE — Telephone Encounter (Signed)
She needed to scheduled EEG - appt made.

## 2015-04-20 NOTE — Telephone Encounter (Signed)
Patient called and requested to speak with with the nurse regarding a EEG that Dr. Terrace Arabia wanted her to have. Please call and advise.

## 2015-04-22 ENCOUNTER — Ambulatory Visit (INDEPENDENT_AMBULATORY_CARE_PROVIDER_SITE_OTHER): Payer: Medicare Other | Admitting: Neurology

## 2015-04-22 DIAGNOSIS — R569 Unspecified convulsions: Secondary | ICD-10-CM

## 2015-04-24 NOTE — Procedures (Signed)
   HISTORY: 49 years old female, with new onset seizure in April 06 2015  TECHNIQUE:  16 channel EEG was performed based on standard 10-16 international system. One channel was dedicated to EKG, which has demonstrates normal sinus rhythm of 66 beats per minutes.  Upon awakening, the posterior background activity was well-developed, in alpha range, 9 Hz, with amplitude of 25 microvoltage, reactive to eye opening and closure.  There was no evidence of epileptiform discharge.  Photic stimulation was not performed due to malfunction photic stimulator   Hyperventilation was performed, there was no abnormality elicit.  No sleep was achieved.  CONCLUSION: This is a  normal awake EEG.  There is no electrodiagnostic evidence of epileptiform discharge

## 2015-06-03 ENCOUNTER — Encounter: Payer: Self-pay | Admitting: Neurology

## 2015-06-03 ENCOUNTER — Ambulatory Visit (INDEPENDENT_AMBULATORY_CARE_PROVIDER_SITE_OTHER): Payer: Medicare Other | Admitting: Neurology

## 2015-06-03 VITALS — BP 120/66 | HR 61 | Ht 66.0 in | Wt 253.0 lb

## 2015-06-03 DIAGNOSIS — R569 Unspecified convulsions: Secondary | ICD-10-CM | POA: Diagnosis not present

## 2015-06-03 NOTE — Progress Notes (Signed)
Chief Complaint  Patient presents with  . Seizures    She is here with her daughter, Shanda Bumps.  She would like to discuss her EEG results.  [facility] attempted to call to schedule MRI brain but she did not return the call and never had the scan.  She denies any further seizure activity.      PATIENT: Erika Knapp DOB: 10-07-1965  Chief Complaint  Patient presents with  . Seizures    She is here with her daughter, Shanda Bumps.  She would like to discuss her EEG results.  [facility] attempted to call to schedule MRI brain but she did not return the call and never had the scan.  She denies any further seizure activity.    HISTORICAL  Erika Knapp is a 49 years old right-handed female, seen in refer by her primary care physician Dr. Greggory Stallion for seizure, she is accompanied by her daughter at today's clinical visit  She had a history of hypertension, hyperlipidemia, pulmonary emboli in 2001, also had a history of knee replacement, gastric bypass in July 2015, successfully lost 200 pounds.  She denies a history of seizure, had her first seizure in April 06 2015  She had a headache the night before, took Imitrex, went to sleep, next morning, with out warning signs, was witnessed by her daughter having generalized seizure on the floor, paramedic was called, she was taken to the emergency room  I reviewed emergency room note in April 06 2015, personally reviewed CAT scan of the brain without contrast that was normal, CMP showed mild elevated creatinine 1.1, mild anemia hemoglobin 11.7, UDS was positive for marijuana  She is now back to baseline, complains of excessive stress at home, but denies visual loss, denies lateralized motor or sensory deficit.  UPDATE Jun 03 2015: She has no recurrent seizure, frequent mild to moderate bilateral frontal headaches, has been taking Tylenol 500 mg 3 tablets all at once, or Imitrex 25 mg as needed which has been helpful,   EEG was normal in  July 2016, MRI of the brain was not done, [facility] imaging failed to reach patient to schedule  REVIEW OF SYSTEMS: Full 14 system review of systems performed and notable only for depression, anxiety, memory loss, blurry vision ALLERGIES: Allergies  Allergen Reactions  . Tramadol Hives    Other reaction(s): HIVES    HOME MEDICATIONS: Current Outpatient Prescriptions  Medication Sig Dispense Refill  . buPROPion (WELLBUTRIN XL) 300 MG 24 hr tablet Take 300 mg by mouth daily.  0  . Calcium Carb-Cholecalciferol (CALCIUM + D3) 600-200 MG-UNIT TABS Take 1 tablet by mouth daily.    Marland Kitchen gabapentin (NEURONTIN) 300 MG capsule Take 1 capsule by mouth 3 (three) times daily.  1  . LATUDA 120 MG TABS Take 1 tablet by mouth at bedtime.  0  . lovastatin (MEVACOR) 20 MG tablet Take 1 tablet by mouth daily.  0  . Multiple Vitamins-Minerals (MULTIVITAMIN WITH MINERALS) tablet Take 1 tablet by mouth daily.    . naproxen (NAPROSYN) 500 MG tablet Take 500 mg by mouth 2 (two) times daily with a meal.  0  . SUMAtriptan (IMITREX) 25 MG tablet Take 1 tablet by mouth daily.  0  . topiramate (TOPAMAX) 25 MG tablet Take 1 tablet by mouth 2 (two) times daily.  0  . triamterene-hydrochlorothiazide (MAXZIDE-25) 37.5-25 MG per tablet Take 1 tablet by mouth daily.  0   No current facility-administered medications for this visit.    PAST  MEDICAL HISTORY: Past Medical History  Diagnosis Date  . Hypertension   . High cholesterol   . Pulmonary embolism     PAST SURGICAL HISTORY: Past Surgical History  Procedure Laterality Date  . Total knee arthroplasty    . Gastric bypass      FAMILY HISTORY: Family History  Problem Relation Age of Onset  . High blood pressure Brother   . High blood pressure Sister   . Depression Brother   . Depression Sister   . Bipolar disorder Brother     SOCIAL HISTORY:  History   Social History  . Marital Status: Single    Spouse Name: N/A  . Number of Children: 2  .  Years of Education: 77 th   Occupational History    Disabled   Social History Main Topics  . Smoking status: Current Every Day Smoker  . Smokeless tobacco: Never Used  . Alcohol Use: 0.0 oz/week    0 Standard drinks or equivalent per week  . Drug Use: Yes    Special: Marijuana  . Sexual Activity: Not on file   Other Topics Concern  . Not on file   Social History Narrative   Patient lives at home alone and her daughter lives with her.   Disabled.   Education 11 th grade.   Right handed.   Caffeine Diet mountain dew one daily.     PHYSICAL EXAM   Filed Vitals:   06/03/15 1336  BP: 120/66  Pulse: 61  Height: 5\' 6"  (1.676 m)  Weight: 253 lb (114.76 kg)    Not recorded      Body mass index is 40.85 kg/(m^2).  PHYSICAL EXAMNIATION:  Gen: NAD, conversant, well nourised, obese, well groomed                     Cardiovascular: Regular rate rhythm, no peripheral edema, warm, nontender. Eyes: Conjunctivae clear without exudates or hemorrhage Neck: Supple, no carotid bruise. Pulmonary: Clear to auscultation bilaterally   NEUROLOGICAL EXAM:  MENTAL STATUS:Depressed looking middle-age female   Speech:    Speech is normal; fluent and spontaneous with normal comprehension.  Cognition:    The patient is oriented to person, place, and time;     recent and remote memory intact;     language fluent;     normal attention, concentration,     fund of knowledge.  CRANIAL NERVES: CN II: Visual fields are full to confrontation. Fundoscopic exam is normal with sharp discs and no vascular changes. pupil were equal round reactive to light  CN III, IV, VI: extraocular movement are normal. No ptosis. CN V: Facial sensation is intact to pinprick in all 3 divisions bilaterally. Corneal responses are intact.  CN VII: Face is symmetric with normal eye closure and smile. CN VIII: Hearing is normal to rubbing fingers CN IX, X: Palate elevates symmetrically. Phonation is normal. CN XI:  Head turning and shoulder shrug are intact CN XII: Tongue is midline with normal movements and no atrophy.  MOTOR: There is no pronator drift of out-stretched arms. Muscle bulk and tone are normal. Muscle strength is normal.  REFLEXES: Reflexes are 2+ and symmetric at the biceps, triceps, knees, and ankles. Plantar responses are flexor.  SENSORY: Light touch, pinprick, position sense, and vibration sense are intact in fingers and toes.  COORDINATION: Rapid alternating movements and fine finger movements are intact. There is no dysmetria on finger-to-nose and heel-knee-shin. There are no abnormal or extraneous movements.  GAIT/STANCE: Posture is normal. Gait is steady with normal steps, base, arm swing, and turning. Heel and toe walking are normal. Tandem gait is normal.  Romberg is absent.   DIAGNOSTIC DATA (LABS, IMAGING, TESTING) - I reviewed patient records, labs, notes, testing and imaging myself where available.  Lab Results  Component Value Date   WBC 6.2 04/06/2015   HGB 11.6* 04/06/2015   HCT 35.7 04/06/2015   MCV 87.3 04/06/2015   PLT 238 04/06/2015      Component Value Date/Time   NA 140 04/06/2015 1243   NA 138 01/22/2015 1706   K 4.2 04/06/2015 1243   K 3.8 01/22/2015 1706   CL 104 04/06/2015 1243   CL 102 01/22/2015 1706   CO2 29 04/06/2015 1243   CO2 28 01/22/2015 1706   GLUCOSE 91 04/06/2015 1243   GLUCOSE 83 01/22/2015 1706   BUN 18 04/06/2015 1243   BUN 15 01/22/2015 1706   CREATININE 1.10* 04/06/2015 1243   CREATININE 1.01* 01/22/2015 1706   CALCIUM 9.1 04/06/2015 1243   CALCIUM 8.1* 01/22/2015 1706   PROT 6.6 04/06/2015 1243   PROT 6.1* 01/22/2015 1706   ALBUMIN 3.4* 04/06/2015 1243   ALBUMIN 3.1* 01/22/2015 1706   AST 21 04/06/2015 1243   AST 12* 01/22/2015 1706   ALT 10* 04/06/2015 1243   ALT 9* 01/22/2015 1706   ALKPHOS 49 04/06/2015 1243   ALKPHOS 43 01/22/2015 1706   BILITOT 0.7 04/06/2015 1243   BILITOT 0.4 01/22/2015 1706    GFRNONAA 58* 04/06/2015 1243   GFRNONAA >60 01/22/2015 1706   GFRNONAA 59* 08/08/2014 2245   GFRAA >60 04/06/2015 1243   GFRAA >60 01/22/2015 1706   GFRAA >60 08/08/2014 2245   ASSESSMENT AND PLAN  Erika Knapp is a 49 y.o. female   with new onset generalized seizure in April 06 2015   1 complete evaluation with MRI of the brain with without contrast  2 EEG was normal in July 2016  3 no driving until seizure free in 6 months 4 return to clinic in 6 months with nurse practitioner   Levert Feinstein, M.D. Ph.D.  [facility] [address], [address] [place], [place] [address] Ph: [phone] Fax: [phone]

## 2015-11-25 ENCOUNTER — Encounter: Payer: Self-pay | Admitting: Emergency Medicine

## 2015-11-25 ENCOUNTER — Emergency Department: Payer: Medicare Other

## 2015-11-25 ENCOUNTER — Emergency Department
Admission: EM | Admit: 2015-11-25 | Discharge: 2015-11-25 | Disposition: A | Discharge status: Home / Self Care | Payer: Medicare Other | Attending: Emergency Medicine | Admitting: Emergency Medicine

## 2015-11-25 DIAGNOSIS — I10 Essential (primary) hypertension: Secondary | ICD-10-CM | POA: Diagnosis not present

## 2015-11-25 DIAGNOSIS — Z79899 Other long term (current) drug therapy: Secondary | ICD-10-CM | POA: Diagnosis not present

## 2015-11-25 DIAGNOSIS — Z791 Long term (current) use of non-steroidal anti-inflammatories (NSAID): Secondary | ICD-10-CM | POA: Diagnosis not present

## 2015-11-25 DIAGNOSIS — F17200 Nicotine dependence, unspecified, uncomplicated: Secondary | ICD-10-CM | POA: Diagnosis not present

## 2015-11-25 DIAGNOSIS — R079 Chest pain, unspecified: Secondary | ICD-10-CM | POA: Diagnosis present

## 2015-11-25 LAB — CBC
HCT: 33.5 % — ABNORMAL LOW (ref 35.0–47.0)
Hemoglobin: 11.3 g/dL — ABNORMAL LOW (ref 12.0–16.0)
MCH: 29.2 pg (ref 26.0–34.0)
MCHC: 33.6 g/dL (ref 32.0–36.0)
MCV: 86.7 fL (ref 80.0–100.0)
Platelets: 189 10*3/uL (ref 150–440)
RBC: 3.87 MIL/uL (ref 3.80–5.20)
RDW: 14.9 % — AB (ref 11.5–14.5)
WBC: 6.1 10*3/uL (ref 3.6–11.0)

## 2015-11-25 LAB — BASIC METABOLIC PANEL
Anion gap: 5 (ref 5–15)
BUN: 18 mg/dL (ref 6–20)
CO2: 29 mmol/L (ref 22–32)
CREATININE: 0.91 mg/dL (ref 0.44–1.00)
Calcium: 8.8 mg/dL — ABNORMAL LOW (ref 8.9–10.3)
Chloride: 107 mmol/L (ref 101–111)
GFR calc Af Amer: >60 mL/min (ref >60)
GLUCOSE: 101 mg/dL — AB (ref 65–99)
Potassium: 3.6 mmol/L (ref 3.5–5.1)
SODIUM: 141 mmol/L (ref 135–145)

## 2015-11-25 LAB — TROPONIN I

## 2015-11-25 MED ORDER — HYDROCODONE-ACETAMINOPHEN 5-325 MG PO TABS
1.0000 | ORAL_TABLET | Freq: Once | ORAL | Status: AC
  Administered 2015-11-25: 1 via ORAL
  Filled 2015-11-25: qty 1

## 2015-11-25 MED ORDER — ONDANSETRON HCL 4 MG/2ML IJ SOLN
4.0000 mg | Freq: Once | INTRAMUSCULAR | Status: AC
Start: 2015-11-25 — End: 2015-11-25
  Administered 2015-11-25: 4 mg via INTRAVENOUS
  Filled 2015-11-25: qty 2

## 2015-11-25 MED ORDER — IOHEXOL 350 MG/ML SOLN
100.0000 mL | Freq: Once | INTRAVENOUS | Status: AC | PRN
  Administered 2015-11-25: 100 mL via INTRAVENOUS

## 2015-11-25 MED ORDER — HYDROMORPHONE HCL 1 MG/ML IJ SOLN
0.5000 mg | Freq: Once | INTRAMUSCULAR | Status: AC
  Administered 2015-11-25: 0.5 mg via INTRAVENOUS
  Filled 2015-11-25: qty 1

## 2015-11-25 NOTE — ED Provider Notes (Signed)
[facility] Emergency Department Provider Note  ____________________________________________  Time seen: Approximately 8:28 PM  I have reviewed the triage vital signs and the nursing notes.   HISTORY  Chief Complaint Chest Pain    HPI Erika Knapp is a 50 y.o. female who reports gradual onset of a pulling sort of chest pain on the left side of her chest is worse with deep breathing. Also worse if she sits up. Patient has a history of blood clot in the past however does not remember what that feels like. The pain does not appear to be very severe with a moderate however the patient is very worried by there are no other associated findings is no coughing fever or chills etc.   Past Medical History  Diagnosis Date  . Hypertension   . High cholesterol   . Pulmonary embolism [facility])     Patient Active Problem List   Diagnosis Date Noted  . Seizures (HCC) 04/10/2015    Past Surgical History  Procedure Laterality Date  . Total knee arthroplasty    . Gastric bypass      Current Outpatient Rx  Name  Route  Sig  Dispense  Refill  . buPROPion (WELLBUTRIN XL) 300 MG 24 hr tablet   Oral   Take 300 mg by mouth daily.      0   . Calcium Carb-Cholecalciferol (CALCIUM + D3) 600-200 MG-UNIT TABS   Oral   Take 1 tablet by mouth daily.         Marland Kitchen gabapentin (NEURONTIN) 300 MG capsule   Oral   Take 1 capsule by mouth 3 (three) times daily.      1   . LATUDA 120 MG TABS   Oral   Take 1 tablet by mouth at bedtime.      0     Dispense as written.   . lovastatin (MEVACOR) 20 MG tablet   Oral   Take 1 tablet by mouth daily.      0   . Multiple Vitamins-Minerals (MULTIVITAMIN WITH MINERALS) tablet   Oral   Take 1 tablet by mouth daily.         . naproxen (NAPROSYN) 500 MG tablet   Oral   Take 500 mg by mouth 2 (two) times daily with a meal.      0   . SUMAtriptan (IMITREX) 25 MG tablet   Oral   Take 1 tablet by mouth daily.      0   . topiramate (TOPAMAX) 25 MG tablet   Oral   Take 1 tablet by mouth 2 (two) times daily.      0   . triamterene-hydrochlorothiazide (MAXZIDE-25) 37.5-25 MG per tablet   Oral   Take 1 tablet by mouth daily.      0     Allergies Tramadol  Family History  Problem Relation Age of Onset  . High blood pressure Brother   . High blood pressure Sister   . Depression Brother   . Depression Sister   . Bipolar disorder Brother     Social History Social History  Substance Use Topics  . Smoking status: Current Every Day Smoker  . Smokeless tobacco: Never Used  . Alcohol Use: 0.0 oz/week    0 Standard drinks or equivalent per week    Review of Systems Constitutional: No fever/chills Eyes: No visual changes. ENT: No sore throat. Cardiovascular: See history of present illness Respiratory: Denies shortness of breath. Gastrointestinal: No abdominal pain.  No nausea, no vomiting.  No diarrhea.  No constipation. Genitourinary: Negative for dysuria. Musculoskeletal: Negative for back pain. Skin: Negative for rash. Neurological: Negative for headaches, focal weakness or numbness.  10-point ROS otherwise negative.  ____________________________________________   PHYSICAL EXAM:  VITAL SIGNS: ED Triage Vitals  Enc Vitals Group     BP 11/25/15 1844 151/79 mmHg     Pulse Rate 11/25/15 1844 63     Resp 11/25/15 1844 16     Temp 11/25/15 1844 98 F (36.7 C)     Temp Source 11/25/15 1844 Oral     SpO2 11/25/15 1844 97 %     Weight 11/25/15 1844 230 lb (104.327 kg)     Height 11/25/15 1844  (1.676 m)     Head Cir --      Peak Flow --      Pain Score 11/25/15 1844 10     Pain Loc --      Pain Edu? --      Excl. in GC? --     Constitutional: Alert and oriented. Well appearing and in no acute distress but she is crying.. Eyes: Conjunctivae are normal. PERRL. EOMI. Head: Atraumatic. Nose: No congestion/rhinnorhea. Mouth/Throat: Mucous membranes are moist.  Oropharynx  non-erythematous. Neck: No stridor. Cardiovascular: Normal rate, regular rhythm. Grossly normal heart sounds.  Good peripheral circulation. Respiratory: Normal respiratory effort.  No retractions. Lungs CTAB. No tenderness to palpation of the chest Gastrointestinal: Soft and nontender. No distention. No abdominal bruits. No CVA tenderness. Musculoskeletal: No lower extremity tenderness nor edema.  No joint effusions. Neurologic:  Normal speech and language. No gross focal neurologic deficits are appreciated. No gait instability. Skin:  Skin is warm, dry and intact. No rash noted. Psychiatric: Mood and affect are normal. Speech and behavior are normal.  ____________________________________________   LABS (all labs ordered are listed, but only abnormal results are displayed)  Labs Reviewed  BASIC METABOLIC PANEL - Abnormal; Notable for the following:    Glucose, Bld 101 (*)    Calcium 8.8 (*)    All other components within normal limits  CBC - Abnormal; Notable for the following:    Hemoglobin 11.3 (*)    HCT 33.5 (*)    RDW 14.9 (*)    All other components within normal limits  TROPONIN I  PREGNANCY, URINE  TROPONIN I   ____________________________________________  EKG  EKG read and interpreted by me shows normal sinus rhythm rate of 60 normal axis essentially normal EKG ____________________________________________  RADIOLOGY   ____________________________________________   PROCEDURES    ____________________________________________   INITIAL IMPRESSION / ASSESSMENT AND PLAN / ED COURSE  Pertinent labs & imaging results that were available during my care of the patient were reviewed by me and considered in my medical decision making (see chart for details).  Patient signed out to Dr. Shaune Pollack ____________________________________________   FINAL CLINICAL IMPRESSION(S) / ED DIAGNOSES  Final diagnoses:  Chest pain, unspecified chest pain type      Arnaldo Natal, MD 11/25/15 2100

## 2015-11-25 NOTE — ED Notes (Signed)
Pt reports left sided chest pain that started yesterday, denies radiation; reports throbbing pain. Pt reports increased pain with deep breaths.

## 2015-11-25 NOTE — ED Provider Notes (Signed)
[facility]  I accepted care from Dr. Juliette Alcide ____________________________________________    LABS (pertinent positives/negatives)  Repeat troponin less than 0.03  ____________________________________________   PROCEDURES  Procedure(s) performed: None  Critical Care performed: None  ____________________________________________   INITIAL IMPRESSION / ASSESSMENT AND PLAN / ED COURSE   Pertinent labs & imaging results that were available during my care of the patient were reviewed by me and considered in my medical decision making (see chart for details).  Patient signed out to me. Reportedly atypical chest discomfort with initially reassuring workup. Patient awaiting repeat troponin.  Repeat troponin negative. I discussed with the patient return precautions, and plan to follow up with cardiology for nontoxic chest pain.  CONSULTATIONS: none    Patient / Family / Caregiver informed of clinical course, medical decision-making process, and agree with plan.   I discussed return precautions, follow-up instructions, and discharged instructions with patient and/or family.     ____________________________________________   FINAL CLINICAL IMPRESSION(S) / ED DIAGNOSES  Final diagnoses:  Chest pain, unspecified chest pain type        Governor Rooks, MD 11/25/15 2143

## 2015-11-25 NOTE — Discharge Instructions (Signed)
You were evaluated in the emergency room for chest pain, and although no certain cause was found, your exam and evaluation are reassuring in the emergency department.  Follow-up with cardiologist in the next 2 days, call tomorrow to make an appointment. Return to emergency department for any new or worsening chest pain, nausea, sweats, weakness and numbness, dizziness or passing out, or any other symptoms concerning to you.   Nonspecific Chest Pain It is often hard to find the cause of chest pain. There is always a chance that your pain could be related to something serious, such as a heart attack or a blood clot in your lungs. Chest pain can also be caused by conditions that are not life-threatening. If you have chest pain, it is very important to follow up with your doctor.  HOME CARE  If you were prescribed an antibiotic medicine, finish it all even if you start to feel better.  Avoid any activities that cause chest pain.  Do not use any tobacco products, including cigarettes, chewing tobacco, or electronic cigarettes. If you need help quitting, ask your doctor.  Do not drink alcohol.  Take medicines only as told by your doctor.  Keep all follow-up visits as told by your doctor. This is important. This includes any further testing if your chest pain does not go away.  Your doctor may tell you to keep your head raised (elevated) while you sleep.  Make lifestyle changes as told by your doctor. These may include:  Getting regular exercise. Ask your doctor to suggest some activities that are safe for you.  Eating a heart-healthy diet. Your doctor or a diet specialist (dietitian) can help you to learn healthy eating options.  Maintaining a healthy weight.  Managing diabetes, if necessary.  Reducing stress. GET HELP IF:  Your chest pain does not go away, even after treatment.  You have a rash with blisters on your chest.  You have a fever. GET HELP RIGHT AWAY IF:  Your chest  pain is worse.  You have an increasing cough, or you cough up blood.  You have severe belly (abdominal) pain.  You feel extremely weak.  You pass out (faint).  You have chills.  You have sudden, unexplained chest discomfort.  You have sudden, unexplained discomfort in your arms, back, neck, or jaw.  You have shortness of breath at any time.  You suddenly start to sweat, or your skin gets clammy.  You feel nauseous.  You vomit.  You suddenly feel light-headed or dizzy.  Your heart begins to beat quickly, or it feels like it is skipping beats. These symptoms may be an emergency. Do not wait to see if the symptoms will go away. Get medical help right away. Call your local emergency services (911 in the [place] Do not drive yourself to the hospital.   This information is not intended to replace advice given to you by your health care provider. Make sure you discuss any questions you have with your health care provider.   Document Released: 02/29/2008 Document Revised: 10/03/2014 Document Reviewed: 04/18/2014 Elsevier Interactive Patient Education [employer]

## 2015-11-25 NOTE — ED Notes (Signed)
Patient transported to CT 

## 2015-12-02 ENCOUNTER — Telehealth: Payer: Self-pay | Admitting: *Deleted

## 2015-12-02 ENCOUNTER — Ambulatory Visit: Payer: Medicare Other | Admitting: Nurse Practitioner

## 2015-12-02 NOTE — Telephone Encounter (Signed)
No showed follow up appointment. 

## 2015-12-03 ENCOUNTER — Encounter: Payer: Self-pay | Admitting: Nurse Practitioner

## 2015-12-15 ENCOUNTER — Ambulatory Visit: Payer: Medicare Other | Admitting: Cardiology

## 2015-12-15 ENCOUNTER — Encounter: Payer: Self-pay | Admitting: *Deleted

## 2017-08-02 ENCOUNTER — Emergency Department
Admission: EM | Admit: 2017-08-02 | Discharge: 2017-08-02 | Disposition: A | Discharge status: Home / Self Care | Payer: Medicare Other | Attending: Emergency Medicine | Admitting: Emergency Medicine

## 2017-08-02 ENCOUNTER — Emergency Department: Payer: Medicare Other

## 2017-08-02 ENCOUNTER — Encounter: Payer: Self-pay | Admitting: *Deleted

## 2017-08-02 DIAGNOSIS — R799 Abnormal finding of blood chemistry, unspecified: Secondary | ICD-10-CM | POA: Diagnosis present

## 2017-08-02 DIAGNOSIS — F17210 Nicotine dependence, cigarettes, uncomplicated: Secondary | ICD-10-CM | POA: Insufficient documentation

## 2017-08-02 DIAGNOSIS — R0609 Other forms of dyspnea: Secondary | ICD-10-CM | POA: Diagnosis not present

## 2017-08-02 DIAGNOSIS — Z96659 Presence of unspecified artificial knee joint: Secondary | ICD-10-CM | POA: Diagnosis not present

## 2017-08-02 DIAGNOSIS — Z86711 Personal history of pulmonary embolism: Secondary | ICD-10-CM | POA: Diagnosis not present

## 2017-08-02 DIAGNOSIS — Z79899 Other long term (current) drug therapy: Secondary | ICD-10-CM | POA: Diagnosis not present

## 2017-08-02 LAB — CBC
HEMATOCRIT: 37.9 % (ref 35.0–47.0)
HEMOGLOBIN: 12.2 g/dL (ref 12.0–16.0)
MCH: 27.5 pg (ref 26.0–34.0)
MCHC: 32.2 g/dL (ref 32.0–36.0)
MCV: 85.2 fL (ref 80.0–100.0)
Platelets: 213 10*3/uL (ref 150–440)
RBC: 4.44 MIL/uL (ref 3.80–5.20)
RDW: 15.2 % — AB (ref 11.5–14.5)
WBC: 4.5 10*3/uL (ref 3.6–11.0)

## 2017-08-02 LAB — TROPONIN I: Troponin I: <0.03 ng/mL (ref <0.03)

## 2017-08-02 LAB — BASIC METABOLIC PANEL
Anion gap: 7 (ref 5–15)
BUN: 21 mg/dL — ABNORMAL HIGH (ref 6–20)
CO2: 25 mmol/L (ref 22–32)
CREATININE: 0.91 mg/dL (ref 0.44–1.00)
Calcium: 8.5 mg/dL — ABNORMAL LOW (ref 8.9–10.3)
Chloride: 108 mmol/L (ref 101–111)
Glucose, Bld: 97 mg/dL (ref 65–99)
Potassium: 4.3 mmol/L (ref 3.5–5.1)
Sodium: 140 mmol/L (ref 135–145)

## 2017-08-02 MED ORDER — IOPAMIDOL (ISOVUE-370) INJECTION 76%
75.0000 mL | Freq: Once | INTRAVENOUS | Status: AC | PRN
  Administered 2017-08-02: 75 mL via INTRAVENOUS

## 2017-08-02 NOTE — ED Notes (Signed)
Pt sitting in subwait accomp by daughter with no distress noted; pt very angry that she is waiting and wants SL taken out; instructed pt on importance of waiting and having ED provider examine her; pt also informed that if I remove her SL there would be a chance that she would have to be stuck again for any possible labs or treatment; pt remains angry but with daughter's encouragement agrees to stay and leave SL in place

## 2017-08-02 NOTE — Discharge Instructions (Signed)
You have been seen in the Emergency Department (ED) today for chest pain.  As we have discussed today?s test results are reassuring. NO BLOOD CLOT IN THE LUNGS ON CT.  Please follow up with the recommended doctor as instructed above in these documents regarding today?s emergent visit and your recent symptoms to discuss further management.  Continue to take your regular medications.  Return to the Emergency Department (ED) if you experience any further chest pain/pressure/tightness, difficulty breathing, or sudden sweating, or other symptoms that concern you.

## 2017-08-02 NOTE — ED Notes (Signed)
E-signature pad only picking up first initial. This RN witnessed her signature but pt tried twice, only beginning of signature showed up.

## 2017-08-02 NOTE — ED Triage Notes (Signed)
States she was seen at PCP office today for shortness of breathe and physical, pt was called and told to go to ER for abnormal labs but pt is not sure what is abnormal, awake and alert in no acute distress

## 2017-08-02 NOTE — ED Provider Notes (Signed)
[facility] Emergency Department Provider Note   ____________________________________________   First MD Initiated Contact with Patient 08/02/17 1941     (approximate)  I have reviewed the triage vital signs and the nursing notes.   HISTORY  Chief Complaint Abnormal Lab    HPI Erika Knapp is a 51 y.o. female here for evaluation as referred by primary care doctor  Patient reports that for about 1-2 months she has been experiencing shortness of breath with exertion, often with walking up and down the stairs.  She does not get any chest pain, but she does experience feeling of shortness of breath frequently.  No nausea or vomiting.  No fevers or chills.  She saw her doctor today and they did test, they told her her x-ray was slightly abnormal and they sent her to the ER for further testing.  Presently she reports no symptoms.  She reports the symptoms have been steady for about 1-2 months.  They do not seem to be getting any worse, but they do not seem to getting much better either.  She is currently not short of breath when resting.  Nurses it slightly when she lays down at night, but more noticeable when she is walking up stairs in particular.  She is not on any blood thinners.  She had a previous pulmonary embolism, but was taken off Coumadin a while ago.  She denies any pleuritic chest pain.  No leg swelling.  Past Medical History:  Diagnosis Date  . High cholesterol   . Hypertension   . Pulmonary embolism [facility])     Patient Active Problem List   Diagnosis Date Noted  . Seizures (HCC) 04/10/2015    Past Surgical History:  Procedure Laterality Date  . GASTRIC BYPASS    . TOTAL KNEE ARTHROPLASTY      Prior to Admission medications   Medication Sig Start Date End Date Taking? Authorizing Provider  LATUDA 120 MG TABS Take 1 tablet by mouth at bedtime. 03/27/15   [provider]  lovastatin (MEVACOR) 20 MG tablet Take 1 tablet by  mouth daily. 03/27/15   [provider]  sertraline (ZOLOFT) 50 MG tablet Take 50 mg by mouth daily.    [provider]  triamterene-hydrochlorothiazide (MAXZIDE-25) 37.5-25 MG per tablet Take 1 tablet by mouth daily. 03/27/15   [provider]    Allergies Tramadol  Family History  Problem Relation Age of Onset  . High blood pressure Brother   . High blood pressure Sister   . Depression Brother   . Depression Sister   . Bipolar disorder Brother     Social History Social History   Tobacco Use  . Smoking status: Current Every Day Smoker  . Smokeless tobacco: Never Used  Substance Use Topics  . Alcohol use: Yes    Alcohol/week: 0.0 oz  . Drug use: Yes    Types: Marijuana    Review of Systems Constitutional: No fever/chills Eyes: No visual changes. ENT: No sore throat. Cardiovascular: Denies chest pain. Respiratory: See HPI Gastrointestinal: No abdominal pain.  No nausea, no vomiting.  No diarrhea.  No constipation. Genitourinary: Negative for dysuria. Musculoskeletal: Negative for back pain. Skin: Negative for rash. Neurological: Negative for headaches, focal weakness or numbness.    ____________________________________________   PHYSICAL EXAM:  VITAL SIGNS: ED Triage Vitals [08/02/17 1723]  Enc Vitals Group     BP (!) 148/110     Pulse Rate 77     Resp 18  Temp 98.3 F (36.8 C)     Temp Source Oral     SpO2 99 %     Weight 276 lb (125.2 kg)     Height 5\' 6"  (1.676 m)     Head Circumference      Peak Flow      Pain Score 0     Pain Loc      Pain Edu?      Excl. in GC?     Constitutional: Alert and oriented. Well appearing and in no acute distress. Eyes: Conjunctivae are normal. Head: Atraumatic. Nose: No congestion/rhinnorhea. Mouth/Throat: Mucous membranes are moist. Neck: No stridor.   Cardiovascular: Normal rate, regular rhythm. Grossly normal heart sounds.  Good peripheral circulation. Respiratory: Normal  respiratory effort.  No retractions. Lungs CTAB.  Speaking in full and clear sentences. Gastrointestinal: Soft and nontender. No distention. Musculoskeletal: No lower extremity tenderness nor edema. Neurologic:  Normal speech and language. No gross focal neurologic deficits are appreciated.  Skin:  Skin is warm, dry and intact. No rash noted. Psychiatric: Mood and affect are normal. Speech and behavior are normal.  ____________________________________________   LABS (all labs ordered are listed, but only abnormal results are displayed)  Labs Reviewed  BASIC METABOLIC PANEL - Abnormal; Notable for the following components:      Result Value   BUN 21 (*)    Calcium 8.5 (*)    All other components within normal limits  CBC - Abnormal; Notable for the following components:   RDW 15.2 (*)    All other components within normal limits  TROPONIN I   ____________________________________________  EKG  ED ECG REPORT I, QUALE, MARK, the attending physician, personally viewed and interpreted this ECG.  Date: 08/02/2017 EKG Time: 1740 Rate: 70 Rhythm: normal sinus rhythm QRS Axis: normal Intervals: normal ST/T Wave abnormalities: normal Narrative Interpretation: no evidence of acute ischemia  ____________________________________________  RADIOLOGY  CT angiography reviewed, no acute pulmonary embolism.  No acute findings noted please see report for additional details  Ct Angio Chest Pe W And/or Wo Contrast  Result Date: 08/02/2017 CLINICAL DATA:  Shortness of breath, clinical concern for pulmonary embolus. EXAM: CT ANGIOGRAPHY CHEST WITH CONTRAST TECHNIQUE: Multidetector CT imaging of the chest was performed using the standard protocol during bolus administration of intravenous contrast. Multiplanar CT image reconstructions and MIPs were obtained to evaluate the vascular anatomy. CONTRAST:  75 cc Isovue 370 COMPARISON:  11/25/2015 chest CT FINDINGS: Cardiovascular: No filling defect  is identified in the pulmonary arterial tree to suggest pulmonary embolus. Upper normal heart size. Mediastinum/Nodes: Unremarkable Lungs/Pleura: There is mild atelectasis along the right hemidiaphragm. 3 mm calcified granuloma in the left lower lobe on image 50/6. Upper Abdomen: Postoperative findings in the stomach. Musculoskeletal: Thoracic spondylosis. Chronic degenerative left sternoclavicular arthropathy. Review of the MIP images confirms the above findings. IMPRESSION: 1. No filling defect is identified in the pulmonary arterial tree to suggest pulmonary embolus. A cause for the patient' s shortness of breath is not seen. 2. Minimal atelectasis along the right hemidiaphragm. 3. Chronic degenerative left sternoclavicular arthropathy. Electronically Signed   By: Gaylyn RongWalter  Liebkemann M.D.   On: 08/02/2017 18:43    ____________________________________________   PROCEDURES  Procedure(s) performed: None  Procedures  Critical Care performed: No  ____________________________________________   INITIAL IMPRESSION / ASSESSMENT AND PLAN / ED COURSE  Pertinent labs & imaging results that were available during my care of the patient were reviewed by me and considered in my medical decision making (  see chart for details).  Patient presents for evaluation of dyspnea.  Does have a history of pulmonary embolism, and had a questionable tortuous aorta on chest x-ray from the clinic which appears to have prompted evaluation.  CT angiography done today that does not demonstrate acute aneurysm, dissection, or pulmonary embolism.  She reports her symptoms have been chronic but reproducible nature for several months.  No chest pain her EKG and troponin are reassuring.  She has no evidence of congestive heart failure on CT or by exam.  No JVD, no peripheral edema is evident.  I doubt acute coronary syndrome, especially given normal EKG, normal troponin, and no chest pain.  Discussed with her and at this point I  recommended she do set up follow-up with both her primary doctor as well as cardiology, and gave her referral information to Dr. Juliann Paresallwood.  We discussed careful return precautions and she and her family are very agreeable.      ____________________________________________   FINAL CLINICAL IMPRESSION(S) / ED DIAGNOSES  Final diagnoses:  Exertional dyspnea      NEW MEDICATIONS STARTED DURING THIS VISIT:  This SmartLink is deprecated. Use AVSMEDLIST instead to display the medication list for a patient.   Note:  This document was prepared using Dragon voice recognition software and may include unintentional dictation errors.     Sharyn CreamerQuale, Mark, MD 08/02/17 [phone]

## 2017-08-02 NOTE — ED Notes (Signed)
Pt ambulatory upon discharge. Pt and family verbalized understanding of discharge instructions and importance of follow-up care. Pt A&O x4. VSS. Skin warm and dry.

## 2017-08-10 ENCOUNTER — Emergency Department
Admission: EM | Admit: 2017-08-10 | Discharge: 2017-08-11 | Disposition: A | Discharge status: Home / Self Care | Payer: Medicare Other | Attending: Emergency Medicine | Admitting: Emergency Medicine

## 2017-08-10 ENCOUNTER — Other Ambulatory Visit: Payer: Self-pay

## 2017-08-10 ENCOUNTER — Encounter: Payer: Self-pay | Admitting: Emergency Medicine

## 2017-08-10 DIAGNOSIS — Z79899 Other long term (current) drug therapy: Secondary | ICD-10-CM | POA: Diagnosis not present

## 2017-08-10 DIAGNOSIS — Z96659 Presence of unspecified artificial knee joint: Secondary | ICD-10-CM | POA: Diagnosis not present

## 2017-08-10 DIAGNOSIS — I10 Essential (primary) hypertension: Secondary | ICD-10-CM | POA: Diagnosis not present

## 2017-08-10 DIAGNOSIS — F17200 Nicotine dependence, unspecified, uncomplicated: Secondary | ICD-10-CM | POA: Insufficient documentation

## 2017-08-10 DIAGNOSIS — K921 Melena: Secondary | ICD-10-CM | POA: Diagnosis not present

## 2017-08-10 DIAGNOSIS — K529 Noninfective gastroenteritis and colitis, unspecified: Secondary | ICD-10-CM | POA: Insufficient documentation

## 2017-08-10 DIAGNOSIS — K625 Hemorrhage of anus and rectum: Secondary | ICD-10-CM | POA: Diagnosis present

## 2017-08-10 LAB — CBC
HEMATOCRIT: 38.6 % (ref 35.0–47.0)
HEMOGLOBIN: 12.5 g/dL (ref 12.0–16.0)
MCH: 27.1 pg (ref 26.0–34.0)
MCHC: 32.4 g/dL (ref 32.0–36.0)
MCV: 83.6 fL (ref 80.0–100.0)
PLATELETS: 239 10*3/uL (ref 150–440)
RBC: 4.62 MIL/uL (ref 3.80–5.20)
RDW: 15.1 % — ABNORMAL HIGH (ref 11.5–14.5)
WBC: 4.6 10*3/uL (ref 3.6–11.0)

## 2017-08-10 MED ORDER — SODIUM CHLORIDE 0.9 % IV BOLUS (SEPSIS)
500.0000 mL | Freq: Once | INTRAVENOUS | Status: AC
  Administered 2017-08-10: 500 mL via INTRAVENOUS

## 2017-08-10 NOTE — ED Triage Notes (Signed)
Patient ambulatory to triage with steady gait, without difficulty or distress noted; pt reports rectal bleeding x hours; denies pain at present but st earlier had episode of cramping lower abd pain; hx gastric bypass 2215yrs ago; denies hx of same; denies constipation or diarrhea

## 2017-08-10 NOTE — ED Notes (Signed)
ED Provider at bedside. 

## 2017-08-10 NOTE — ED Provider Notes (Signed)
[facility] Emergency Department Provider Note   ____________________________________________   First MD Initiated Contact with Patient 08/10/17 2312     (approximate)  I have reviewed the triage vital signs and the nursing notes.   HISTORY  Chief Complaint No chief complaint on file.    HPI Erika Knapp is a 51 y.o. female who comes into the hospital today with some rectal bleeding.  The patient states that she went to use the restroom earlier this evening and she noticed some bleeding from the bottom.  She reports that when she wiped there was blood all over her hands.  She went back and had another bowel movement and states that there was blood in the toilet.  She reports that it was a normal bowel movement.  She denies any straining or pushing.  The patient has never had anything like it in the past and she denies a history of hemorrhoids.  The patient had some abdominal pain earlier today but reports that it is subsided.  She had a headache as well today but no lightheadedness or dizziness.  The patient denies any chest pain, shortness of breath, nausea or vomiting.  She was concerned about this bleeding so she decided to come into the hospital today for further evaluation.   Past Medical History:  Diagnosis Date  . High cholesterol   . Hypertension   . Pulmonary embolism [facility])     Patient Active Problem List   Diagnosis Date Noted  . Seizures (HCC) 04/10/2015    Past Surgical History:  Procedure Laterality Date  . GASTRIC BYPASS    . TOTAL KNEE ARTHROPLASTY      Prior to Admission medications   Medication Sig Start Date End Date Taking? Authorizing Provider  ciprofloxacin (CIPRO) 500 MG tablet Take 1 tablet (500 mg total) 2 (two) times daily for 7 days by mouth. 08/11/17 08/18/17  Rebecka Apley, MD  LATUDA 120 MG TABS Take 1 tablet by mouth at bedtime. 03/27/15   [provider]  lovastatin (MEVACOR) 20 MG tablet Take 1  tablet by mouth daily. 03/27/15   [provider]  metroNIDAZOLE (FLAGYL) 500 MG tablet Take 1 tablet (500 mg total) 2 (two) times daily for 7 days by mouth. 08/11/17 08/18/17  Rebecka Apley, MD  sertraline (ZOLOFT) 50 MG tablet Take 50 mg by mouth daily.    [provider]  triamterene-hydrochlorothiazide (MAXZIDE-25) 37.5-25 MG per tablet Take 1 tablet by mouth daily. 03/27/15   [provider]    Allergies Tramadol  Family History  Problem Relation Age of Onset  . High blood pressure Brother   . High blood pressure Sister   . Depression Brother   . Depression Sister   . Bipolar disorder Brother     Social History Social History   Tobacco Use  . Smoking status: Current Every Day Smoker  . Smokeless tobacco: Never Used  Substance Use Topics  . Alcohol use: Yes    Alcohol/week: 0.0 oz    Comment: beer/day  . Drug use: Yes    Types: Marijuana    Review of Systems  Constitutional: No fever/chills Eyes: No visual changes. ENT: No sore throat. Cardiovascular: Denies chest pain. Respiratory: Denies shortness of breath. Gastrointestinal:  abdominal pain.  No nausea, no vomiting.  No diarrhea.  No constipation. Genitourinary: Rectal bleeding Musculoskeletal: Negative for back pain. Skin: Negative for rash. Neurological:  Headaches without focal weakness or numbness.   ____________________________________________   PHYSICAL  EXAM:  VITAL SIGNS: ED Triage Vitals  Enc Vitals Group     BP 08/10/17 2300 140/75     Pulse Rate 08/10/17 2300 63     Resp 08/10/17 2300 18     Temp 08/10/17 2300 97.9 F (36.6 C)     Temp Source 08/10/17 2300 Oral     SpO2 08/10/17 2300 100 %     Weight 08/10/17 2301 278 lb (126.1 kg)     Height 08/10/17 2301 5\' 6"  (1.676 m)     Head Circumference --      Peak Flow --      Pain Score --      Pain Loc --      Pain Edu? --      Excl. in GC? --     Constitutional: Alert and oriented. Well appearing and in  mild distress. Eyes: Conjunctivae are normal. PERRL. EOMI. Head: Atraumatic. Nose: No congestion/rhinnorhea. Mouth/Throat: Mucous membranes are moist.  Oropharynx non-erythematous. Cardiovascular: Normal rate, regular rhythm. Grossly normal heart sounds.  Good peripheral circulation. Respiratory: Normal respiratory effort.  No retractions. Lungs CTAB. Rectal: redish brown stool, heme positive, no visible  Musculoskeletal: No lower extremity tenderness nor edema.   Neurologic:  Normal speech and language.  Skin:  Skin is warm, dry and intact.  Psychiatric: Mood and affect are normal.   ____________________________________________   LABS (all labs ordered are listed, but only abnormal results are displayed)  Labs Reviewed  CBC - Abnormal; Notable for the following components:      Result Value   RDW 15.1 (*)    All other components within normal limits  COMPREHENSIVE METABOLIC PANEL - Abnormal; Notable for the following components:   Glucose, Bld 106 (*)    Calcium 8.3 (*)    ALT 10 (*)    All other components within normal limits  TYPE AND SCREEN   ____________________________________________  EKG  none ____________________________________________  RADIOLOGY  Ct Abdomen Pelvis W Contrast  Result Date: 08/11/2017 CLINICAL DATA:  Acute onset of rectal bleeding and cramping lower abdominal pain. EXAM: CT ABDOMEN AND PELVIS WITH CONTRAST TECHNIQUE: Multidetector CT imaging of the abdomen and pelvis was performed using the standard protocol following bolus administration of intravenous contrast. CONTRAST:  125mL ISOVUE-300 IOPAMIDOL (ISOVUE-300) INJECTION 61% COMPARISON:  CT of the abdomen and pelvis performed 01/20/2011, and right upper quadrant ultrasound performed 07/01/2013 FINDINGS: Lower chest: Minimal hazy opacity at the right lung base may reflect atelectasis or possibly mild infection. The visualized portions of the mediastinum are unremarkable. Hepatobiliary: The liver  is unremarkable in appearance. The gallbladder is unremarkable in appearance. The common bile duct remains normal in caliber. Pancreas: The pancreas is within normal limits. Spleen: The spleen is unremarkable in appearance. Adrenals/Urinary Tract: The adrenal glands are unremarkable in appearance. The kidneys are within normal limits. There is no evidence of hydronephrosis. No renal or ureteral stones are identified. No perinephric stranding is seen. Stomach/Bowel: There is vague soft tissue inflammation about the gastrojejunal anastomosis, which may reflect a mild infectious or inflammatory process. The distal stomach is relatively decompressed and grossly unremarkable. The appendix is normal in caliber, without evidence of appendicitis. The colon is grossly unremarkable in appearance. The small bowel is unremarkable in appearance. Vascular/Lymphatic: The abdominal aorta is unremarkable in appearance. The inferior vena cava is grossly unremarkable. No retroperitoneal lymphadenopathy is seen. No pelvic sidewall lymphadenopathy is identified. Reproductive: Vague decreased attenuation is noted within the uterus. Underlying mass cannot be excluded. A 3.5 cm  right adnexal cystic focus is likely physiologic in nature. No suspicious adnexal masses are seen. Other: No additional soft tissue abnormalities are seen. Musculoskeletal: No acute osseous abnormalities are identified. Facet disease is noted at the lower lumbar spine. The visualized musculature is unremarkable in appearance. IMPRESSION: 1. Vague soft tissue inflammation about the patient's gastrojejunal anastomosis, which may reflect a mild infectious or inflammatory process. 2. Vague decreased attenuation noted within the uterus. Underlying mass cannot be excluded. Pelvic ultrasound would be helpful for further evaluation, when and as deemed clinically appropriate. 3. Minimal hazy opacity at the right lung base may reflect atelectasis or possibly mild infection.  Electronically Signed   By: Roanna RaiderJeffery  Chang M.D.   On: 08/11/2017 02:05    ____________________________________________   PROCEDURES  Procedure(s) performed: None  Procedures  Critical Care performed: No  ____________________________________________   INITIAL IMPRESSION / ASSESSMENT AND PLAN / ED COURSE  As part of my medical decision making, I reviewed the following data within the electronic MEDICAL RECORD NUMBER Notes from prior ED visits and [place] Controlled Substance Database   This is a 51 year old female who comes into the hospital today with some rectal bleeding.  The patient had some bright red blood in her stool.  My differential diagnosis includes internal hemorrhoids, diverticulosis, colitis  I will check some blood work to see if the patient has a decrease hemoglobin hematocrit.  I will give the patient some fluids and then I will consider doing a CT scan looking for colitis.  The patient will be reassessed.      Patient's blood work is unremarkable.  Her hemoglobin is 12.5.  Patient CT scan showed a vague soft tissue inflammation about the patient's gastrojejunal anastomosis which could be infectious or inflammatory.  The patient also had a vague decreased attenuation within the uterus and a mild hazy opacity at the right lung which could reflect atelectasis.  The patient did receive a dose of ciprofloxacin and Flagyl.  At this time she is doing well the patient will be discharged to follow-up with her bariatric surgeon.  I did encourage the patient that should she have any lightheadedness worsening bleeding or any other concerns she should return to the emergency department for further evaluation.  ____________________________________________   FINAL CLINICAL IMPRESSION(S) / ED DIAGNOSES  Final diagnoses:  Colitis  Hematochezia     ED Discharge Orders        Ordered    ciprofloxacin (CIPRO) 500 MG tablet  2 times daily     08/11/17 0359    metroNIDAZOLE (FLAGYL)  500 MG tablet  2 times daily     08/11/17 0359       Note:  This document was prepared using Dragon voice recognition software and may include unintentional dictation errors.    Rebecka ApleyWebster, Tyreik Delahoussaye P, MD 08/11/17 [id number]

## 2017-08-11 ENCOUNTER — Emergency Department: Payer: Medicare Other

## 2017-08-11 DIAGNOSIS — K529 Noninfective gastroenteritis and colitis, unspecified: Secondary | ICD-10-CM | POA: Diagnosis not present

## 2017-08-11 LAB — COMPREHENSIVE METABOLIC PANEL
ALBUMIN: 3.7 g/dL (ref 3.5–5.0)
ALT: 10 U/L — AB (ref 14–54)
AST: 16 U/L (ref 15–41)
Alkaline Phosphatase: 50 U/L (ref 38–126)
Anion gap: 10 (ref 5–15)
BILIRUBIN TOTAL: 0.8 mg/dL (ref 0.3–1.2)
BUN: 18 mg/dL (ref 6–20)
CHLORIDE: 101 mmol/L (ref 101–111)
CO2: 27 mmol/L (ref 22–32)
CREATININE: 0.94 mg/dL (ref 0.44–1.00)
Calcium: 8.3 mg/dL — ABNORMAL LOW (ref 8.9–10.3)
GFR calc Af Amer: >60 mL/min (ref >60)
GFR calc non Af Amer: >60 mL/min (ref >60)
GLUCOSE: 106 mg/dL — AB (ref 65–99)
POTASSIUM: 4.2 mmol/L (ref 3.5–5.1)
Sodium: 138 mmol/L (ref 135–145)
Total Protein: 6.6 g/dL (ref 6.5–8.1)

## 2017-08-11 LAB — TYPE AND SCREEN
ABO/RH(D): A NEG
Antibody Screen: NEGATIVE

## 2017-08-11 MED ORDER — METRONIDAZOLE 500 MG PO TABS
500.0000 mg | ORAL_TABLET | Freq: Once | ORAL | Status: AC
  Administered 2017-08-11: 500 mg via ORAL
  Filled 2017-08-11: qty 1

## 2017-08-11 MED ORDER — CIPROFLOXACIN IN D5W 400 MG/200ML IV SOLN
400.0000 mg | Freq: Once | INTRAVENOUS | Status: AC
  Administered 2017-08-11: 400 mg via INTRAVENOUS
  Filled 2017-08-11: qty 200

## 2017-08-11 MED ORDER — CIPROFLOXACIN HCL 500 MG PO TABS: 500.0000 mg | ORAL_TABLET | Freq: Two times a day (BID) | ORAL | 0 refills | Status: AC

## 2017-08-11 MED ORDER — METRONIDAZOLE 500 MG PO TABS: 500.0000 mg | ORAL_TABLET | Freq: Two times a day (BID) | ORAL | 0 refills | Status: AC

## 2017-08-11 MED ORDER — IOPAMIDOL (ISOVUE-300) INJECTION 61%
125.0000 mL | Freq: Once | INTRAVENOUS | Status: AC | PRN
  Administered 2017-08-11: 125 mL via INTRAVENOUS

## 2017-08-11 NOTE — Discharge Instructions (Signed)
At this time your vital signs are unremarkable and your blood work is unremarkable. Please follow up with your primary care physician and return with any worsened condition.

## 2017-08-11 NOTE — ED Notes (Signed)
Pt ambulatory upon discharge. Pt and significant other verbalized understanding of discharge instructions, follow-up care and prescriptions. VSS. A&Ox4. Skin warm and dry.

## 2017-08-11 NOTE — ED Notes (Signed)
Patient transported to CT 

## 2017-08-28 ENCOUNTER — Emergency Department
Admission: EM | Admit: 2017-08-28 | Discharge: 2017-08-28 | Discharge status: Against Medical Advice | Payer: Medicare Other

## 2017-09-21 ENCOUNTER — Emergency Department: Payer: Medicare Other

## 2017-09-21 ENCOUNTER — Other Ambulatory Visit: Payer: Self-pay

## 2017-09-21 ENCOUNTER — Emergency Department
Admission: EM | Admit: 2017-09-21 | Discharge: 2017-09-21 | Disposition: A | Discharge status: Home / Self Care | Payer: Medicare Other | Attending: Emergency Medicine | Admitting: Emergency Medicine

## 2017-09-21 DIAGNOSIS — Z79899 Other long term (current) drug therapy: Secondary | ICD-10-CM | POA: Diagnosis not present

## 2017-09-21 DIAGNOSIS — E7800 Pure hypercholesterolemia, unspecified: Secondary | ICD-10-CM | POA: Insufficient documentation

## 2017-09-21 DIAGNOSIS — R42 Dizziness and giddiness: Secondary | ICD-10-CM

## 2017-09-21 DIAGNOSIS — R109 Unspecified abdominal pain: Secondary | ICD-10-CM | POA: Diagnosis not present

## 2017-09-21 DIAGNOSIS — I10 Essential (primary) hypertension: Secondary | ICD-10-CM | POA: Diagnosis not present

## 2017-09-21 DIAGNOSIS — Z86718 Personal history of other venous thrombosis and embolism: Secondary | ICD-10-CM | POA: Diagnosis not present

## 2017-09-21 DIAGNOSIS — F17200 Nicotine dependence, unspecified, uncomplicated: Secondary | ICD-10-CM | POA: Diagnosis not present

## 2017-09-21 LAB — CBC
HEMATOCRIT: 39.4 % (ref 35.0–47.0)
HEMOGLOBIN: 12.8 g/dL (ref 12.0–16.0)
MCH: 27 pg (ref 26.0–34.0)
MCHC: 32.6 g/dL (ref 32.0–36.0)
MCV: 82.8 fL (ref 80.0–100.0)
Platelets: 249 10*3/uL (ref 150–440)
RBC: 4.75 MIL/uL (ref 3.80–5.20)
RDW: 16.4 % — ABNORMAL HIGH (ref 11.5–14.5)
WBC: 3.8 10*3/uL (ref 3.6–11.0)

## 2017-09-21 LAB — COMPREHENSIVE METABOLIC PANEL
ALT: 8 U/L — ABNORMAL LOW (ref 14–54)
ANION GAP: 8 (ref 5–15)
AST: 18 U/L (ref 15–41)
Albumin: 3.6 g/dL (ref 3.5–5.0)
Alkaline Phosphatase: 48 U/L (ref 38–126)
BUN: 11 mg/dL (ref 6–20)
CHLORIDE: 104 mmol/L (ref 101–111)
CO2: 25 mmol/L (ref 22–32)
Calcium: 8.4 mg/dL — ABNORMAL LOW (ref 8.9–10.3)
Creatinine, Ser: 0.94 mg/dL (ref 0.44–1.00)
Glucose, Bld: 111 mg/dL — ABNORMAL HIGH (ref 65–99)
POTASSIUM: 3.3 mmol/L — AB (ref 3.5–5.1)
Sodium: 137 mmol/L (ref 135–145)
Total Bilirubin: 1.6 mg/dL — ABNORMAL HIGH (ref 0.3–1.2)
Total Protein: 6.6 g/dL (ref 6.5–8.1)

## 2017-09-21 LAB — VITAMIN B12: Vitamin B-12: 282 pg/mL (ref 180–914)

## 2017-09-21 LAB — URINALYSIS, COMPLETE (UACMP) WITH MICROSCOPIC
Bilirubin Urine: NEGATIVE
GLUCOSE, UA: NEGATIVE mg/dL
KETONES UR: NEGATIVE mg/dL
LEUKOCYTES UA: NEGATIVE
Nitrite: NEGATIVE
PH: 5 (ref 5.0–8.0)
PROTEIN: 30 mg/dL — AB
Specific Gravity, Urine: 1.024 (ref 1.005–1.030)

## 2017-09-21 LAB — PREGNANCY, URINE: PREG TEST UR: NEGATIVE

## 2017-09-21 LAB — LIPASE, BLOOD: LIPASE: 29 U/L (ref 11–51)

## 2017-09-21 LAB — TSH: TSH: 0.969 u[IU]/mL (ref 0.350–4.500)

## 2017-09-21 LAB — TROPONIN I: Troponin I: <0.03 ng/mL (ref <0.03)

## 2017-09-21 MED ORDER — IOPAMIDOL (ISOVUE-370) INJECTION 76%
85.0000 mL | Freq: Once | INTRAVENOUS | Status: AC | PRN
  Administered 2017-09-21: 85 mL via INTRAVENOUS

## 2017-09-21 MED ORDER — IOPAMIDOL (ISOVUE-300) INJECTION 61%: 30.0000 mL | Freq: Once | INTRAVENOUS | Status: DC | PRN

## 2017-09-21 MED ORDER — SODIUM CHLORIDE 0.9 % IV BOLUS (SEPSIS)
1000.0000 mL | Freq: Once | INTRAVENOUS | Status: AC
  Administered 2017-09-21: 1000 mL via INTRAVENOUS

## 2017-09-21 NOTE — Discharge Instructions (Signed)
Return to the emergency department immediately for new, worsening, or persistent abdominal pain, dizziness or lightheadedness, weakness, passing out, chest pain, difficulty breathing, severe headache, vomiting, or any other new or worsening symptoms that concern you.  Follow-up with your primary care doctor within 1-2 weeks.  You should have your urine rechecked to see if there is still blood and may need further workup as an outpatient for this.

## 2017-09-21 NOTE — ED Triage Notes (Signed)
Pt c/o abd pressure, states when she stands up she gets dizzy. And every time she eats something she has vomiting for the past 2 weeks the patient is in NAD on arrival. Ambulatory to triage.

## 2017-09-21 NOTE — ED Provider Notes (Signed)
[facility] Emergency Department Provider Note ____________________________________________   First MD Initiated Contact with Patient 09/21/17 1147     (approximate)  I have reviewed the triage vital signs and the nursing notes.   HISTORY  Chief Complaint Abdominal Pain and Dizziness    HPI Erika Knapp is a 51 y.o. female with past medical history as noted below including gastric bypass 2 years ago who presents with abdominal pain, for approximately the last 3 weeks, intermittent but occurring multiple times per day, and described as a pulling sensation that starts in her upper abdomen and then ranges down to the lower abdomen.  Patient states that the episodes of abdominal pain are worse when she stands up, walks around, or exerts herself, and that they last approximately 10 minutes.  Patient states that along with this abdominal pain she feels lightheaded and near syncopal at times.  Patient states when she lays down the symptoms resolve.    She states she was seen in the ER twice last month, initially for rectal bleeding which has since resolved, but also for the abdominal pain.  She took a course of antibiotics after her ED visit, and states that this specific pain and dizziness started after that time.  Patient states she has not been able to follow-up with her bariatric surgeon since the last ED visit.  Patient denies any chest pain, leg pain or swelling except for chronic very mild left foot swelling, or shortness of breath.  Past Medical History:  Diagnosis Date  . High cholesterol   . Hypertension   . Pulmonary embolism [facility])     Patient Active Problem List   Diagnosis Date Noted  . Seizures (HCC) 04/10/2015    Past Surgical History:  Procedure Laterality Date  . GASTRIC BYPASS    . TOTAL KNEE ARTHROPLASTY      Prior to Admission medications   Medication Sig Start Date End Date Taking? Authorizing Provider  LATUDA 120 MG TABS Take 1  tablet by mouth at bedtime. 03/27/15  Yes [provider]  lovastatin (MEVACOR) 20 MG tablet Take 1 tablet by mouth daily. 03/27/15  Yes [provider]  sertraline (ZOLOFT) 50 MG tablet Take 50 mg by mouth daily.   Yes [provider]  triamterene-hydrochlorothiazide (MAXZIDE-25) 37.5-25 MG per tablet Take 1 tablet by mouth daily. 03/27/15  Yes [provider]    Allergies Tramadol  Family History  Problem Relation Age of Onset  . High blood pressure Brother   . High blood pressure Sister   . Depression Brother   . Depression Sister   . Bipolar disorder Brother     Social History Social History   Tobacco Use  . Smoking status: Current Every Day Smoker  . Smokeless tobacco: Never Used  Substance Use Topics  . Alcohol use: Yes    Alcohol/week: 0.0 oz    Comment: beer/day  . Drug use: Yes    Types: Marijuana    Review of Systems  Constitutional: No fever/chills. Eyes: No visual changes. ENT: No sore throat. Cardiovascular: Denies chest pain. Respiratory: Denies shortness of breath. Gastrointestinal: Positive for one episode of vomiting yesterday.  No diarrhea.  Genitourinary: Negative for dysuria or frequency.  Musculoskeletal: Negative for back pain. Skin: Negative for rash. Neurological: Negative for headache.   ____________________________________________   PHYSICAL EXAM:  VITAL SIGNS: ED Triage Vitals  Enc Vitals Group     BP 09/21/17 1000 129/77     Pulse Rate 09/21/17  1000 80     Resp 09/21/17 1000 18     Temp 09/21/17 1000 98.3 F (36.8 C)     Temp Source 09/21/17 1000 Oral     SpO2 09/21/17 1000 97 %     Weight 09/21/17 0957 273 lb (123.8 kg)     Height 09/21/17 0957 5\' 6"  (1.676 m)     Head Circumference --      Peak Flow --      Pain Score 09/21/17 0956 7     Pain Loc --      Pain Edu? --      Excl. in GC? --     Constitutional: Alert and oriented. Well appearing and in no acute distress. Eyes: Conjunctivae  are normal.  No scleral icterus. Head: Atraumatic. Nose: No congestion/rhinnorhea. Mouth/Throat: Mucous membranes are moist.   Neck: Normal range of motion.  Cardiovascular: Normal rate, regular rhythm. Grossly normal heart sounds.  Good peripheral circulation. Respiratory: Normal respiratory effort.  No retractions. Lungs CTAB. Gastrointestinal: Soft with mild left mid abdominal tenderness. No distention.  Genitourinary: No CVA tenderness. Musculoskeletal: No lower extremity edema.  No calf or popliteal area swelling or tenderness.  Extremities warm and well perfused.  Neurologic:  Normal speech and language. No gross focal neurologic deficits are appreciated.  Skin:  Skin is warm and dry. No rash noted. Psychiatric: Mood and affect are normal. Speech and behavior are normal.  ____________________________________________   LABS (all labs ordered are listed, but only abnormal results are displayed)  Labs Reviewed  COMPREHENSIVE METABOLIC PANEL - Abnormal; Notable for the following components:      Result Value   Potassium 3.3 (*)    Glucose, Bld 111 (*)    Calcium 8.4 (*)    ALT 8 (*)    Total Bilirubin 1.6 (*)    All other components within normal limits  CBC - Abnormal; Notable for the following components:   RDW 16.4 (*)    All other components within normal limits  URINALYSIS, COMPLETE (UACMP) WITH MICROSCOPIC - Abnormal; Notable for the following components:   Color, Urine AMBER (*)    APPearance CLOUDY (*)    Hgb urine dipstick MODERATE (*)    Protein, ur 30 (*)    Bacteria, UA RARE (*)    Squamous Epithelial / LPF 6-30 (*)    All other components within normal limits  LIPASE, BLOOD  TROPONIN I  TSH  VITAMIN B12  PREGNANCY, URINE   ____________________________________________  EKG  ED ECG REPORT I, Dionne BucySebastian Idalia Allbritton, the attending physician, personally viewed and interpreted this ECG.  Date: 09/21/2017 EKG Time: 1239 Rate: 54 Rhythm: normal sinus  rhythm QRS Axis: normal Intervals: normal ST/T Wave abnormalities: normal Narrative Interpretation: no evidence of acute ischemia  ____________________________________________  RADIOLOGY  CT abd: No acute findings US pelvis: R ovarian cyst, fibroids; no other acute findings  ____________________________________________   PROCEDURES  Procedure(s) performed: No    Critical Care performed: No ____________________________________________   INITIAL IMPRESSION / ASSESSMENT AND PLAN / ED COURSE  Pertinent labs & imaging results that were available during my care of the patient were reviewed by me and considered in my medical decision making (see chart for details).  51 year old female with past medical history as noted above including gastric bypass 2 years ago presents with approximately 3 weeks of intermittent episodes of abdominal pain associated with lightheadedness and sometimes near syncope.  Patient reports rectal bleeding last month which has resolved.  She took an  antibiotic course after last ED visit, and states that the current symptoms started after that visit.  I reviewed the past medical records in Epic; patient was seen in the ED on 08/02/2017 for exertional shortness of breath.  She has a remote history of PE but is no longer on anticoagulation.  A CT of the chest was performed at that time and was negative for acute PE.  ED again on 08/10/2017 for rectal bleeding and resolved abdominal pain.  CT abdomen at that time showed soft tissue inflammation around her GJ anastomosis and vague decreased attenuation noted in the uterus.  She was started on antibiotics for possible intestinal infection.  On exam, the patient is well-appearing, the vital signs are normal, and the remainder the exam is unremarkable except for mild left mid abdominal tenderness.  Differential is broad for this somewhat atypical presentation.  I am considering recurrent/persistent colitis or enteritis  that was possibly identified and partially treated on the last ED visit, other acute intra-abdominal process, UTI or pyelonephritis although this is somewhat less likely given the paucity of urinary symptoms, other metabolic or endocrine cause such as hypothyroidism or electrolyte abnormality, or possible pelvic mass given the abnormal finding on the prior CT.  Given that patient's vital signs are normal, she has no chest pain or shortness of breath, no EKG changes, and no acute leg pain or swelling, there is no clinical evidence for DVT or PE or indication for testing at this time especially given that patient had a negative CT chest with the same symptoms 1 month ago.  Plan: Repeat CT abdomen, pelvic ultrasound, labs, UA, fluids, and reassess.    ----------------------------------------- 3:45 PM on 09/21/2017 -----------------------------------------  The patient's workup is almost entirely negative.  The CT shows no acute findings and no persistent thickening or inflammation is seen on the last CT.  Pelvic ultrasound shows no ovarian cyst but is also otherwise negative.  The lab workup is unremarkable except for hematuria, but this is nonspecific and does not correspond to any symptoms that the patient is having.  The patient's vital signs remained stable, she continues to be comfortable appearing, and now ate a meal and tolerated it without difficulty.  Overall I suspect that her symptoms are due to benign causes such as mild enteritis, mild dehydration, viral syndrome, or other nonspecific causes.  I had an extensive discussion with the patient about the results of her workup and return precautions; the patient expressed understanding.  I explained to the patient about the finding of hematuria and the importance of having this followed up with her regular doctor.  She agrees to make an appointment to follow-up with her primary care doctor within the next week.    ____________________________________________   FINAL CLINICAL IMPRESSION(S) / ED DIAGNOSES  Final diagnoses:  Abdominal pain  Lightheadedness      NEW MEDICATIONS STARTED DURING THIS VISIT:  This SmartLink is deprecated. Use AVSMEDLIST instead to display the medication list for a patient.   Note:  This document was prepared using Dragon voice recognition software and may include unintentional dictation errors.    Dionne BucySiadecki, Pranshu Lyster, MD 09/21/17 [phone]

## 2017-09-21 NOTE — ED Notes (Signed)
Patient verbalized understanding of discharge instructions and follow-up care. Ambulatory to lobby with NAD noted. Signature pad in room not working. Hard copy of discharge signature placed in chart.

## 2017-09-21 NOTE — ED Notes (Signed)
Pt called out stating she finished oral contrast. CT notified.

## 2017-09-21 NOTE — ED Notes (Signed)
Patient given sandwich tray and drink per MD.

## 2019-07-26 ENCOUNTER — Other Ambulatory Visit: Payer: Self-pay | Admitting: *Deleted

## 2019-07-26 DIAGNOSIS — Z20822 Contact with and (suspected) exposure to covid-19: Secondary | ICD-10-CM

## 2019-07-27 LAB — NOVEL CORONAVIRUS, NAA: SARS-CoV-2, NAA: NOT DETECTED

## 2019-08-12 ENCOUNTER — Emergency Department
Admission: EM | Admit: 2019-08-12 | Discharge: 2019-08-12 | Disposition: A | Discharge status: Home / Self Care | Payer: Medicare Other | Attending: Emergency Medicine | Admitting: Emergency Medicine

## 2019-08-12 ENCOUNTER — Other Ambulatory Visit: Payer: Self-pay

## 2019-08-12 ENCOUNTER — Encounter: Payer: Self-pay | Admitting: Emergency Medicine

## 2019-08-12 DIAGNOSIS — R197 Diarrhea, unspecified: Secondary | ICD-10-CM | POA: Diagnosis present

## 2019-08-12 DIAGNOSIS — I10 Essential (primary) hypertension: Secondary | ICD-10-CM | POA: Insufficient documentation

## 2019-08-12 DIAGNOSIS — Z86711 Personal history of pulmonary embolism: Secondary | ICD-10-CM | POA: Insufficient documentation

## 2019-08-12 DIAGNOSIS — K2920 Alcoholic gastritis without bleeding: Secondary | ICD-10-CM | POA: Insufficient documentation

## 2019-08-12 DIAGNOSIS — Z79899 Other long term (current) drug therapy: Secondary | ICD-10-CM | POA: Diagnosis not present

## 2019-08-12 DIAGNOSIS — F1210 Cannabis abuse, uncomplicated: Secondary | ICD-10-CM | POA: Diagnosis not present

## 2019-08-12 DIAGNOSIS — N390 Urinary tract infection, site not specified: Secondary | ICD-10-CM | POA: Insufficient documentation

## 2019-08-12 DIAGNOSIS — F17210 Nicotine dependence, cigarettes, uncomplicated: Secondary | ICD-10-CM | POA: Insufficient documentation

## 2019-08-12 LAB — URINALYSIS, COMPLETE (UACMP) WITH MICROSCOPIC
Bilirubin Urine: NEGATIVE
Glucose, UA: NEGATIVE mg/dL
Ketones, ur: 5 mg/dL — AB
Nitrite: POSITIVE — AB
Protein, ur: NEGATIVE mg/dL
Specific Gravity, Urine: 1.016 (ref 1.005–1.030)
pH: 5 (ref 5.0–8.0)

## 2019-08-12 LAB — LIPASE, BLOOD: Lipase: 83 U/L — ABNORMAL HIGH (ref 11–51)

## 2019-08-12 LAB — COMPREHENSIVE METABOLIC PANEL
ALT: 28 U/L (ref 0–44)
AST: 50 U/L — ABNORMAL HIGH (ref 15–41)
Albumin: 3 g/dL — ABNORMAL LOW (ref 3.5–5.0)
Alkaline Phosphatase: 74 U/L (ref 38–126)
Anion gap: 11 (ref 5–15)
BUN: 7 mg/dL (ref 6–20)
CO2: 25 mmol/L (ref 22–32)
Calcium: 8 mg/dL — ABNORMAL LOW (ref 8.9–10.3)
Chloride: 100 mmol/L (ref 98–111)
Creatinine, Ser: 0.75 mg/dL (ref 0.44–1.00)
GFR calc Af Amer: >60 mL/min (ref >60)
GFR calc non Af Amer: >60 mL/min (ref >60)
Glucose, Bld: 107 mg/dL — ABNORMAL HIGH (ref 70–99)
Potassium: 3.8 mmol/L (ref 3.5–5.1)
Sodium: 136 mmol/L (ref 135–145)
Total Bilirubin: 1.3 mg/dL — ABNORMAL HIGH (ref 0.3–1.2)
Total Protein: 7 g/dL (ref 6.5–8.1)

## 2019-08-12 LAB — CBC
HCT: 35.4 % — ABNORMAL LOW (ref 36.0–46.0)
Hemoglobin: 11.8 g/dL — ABNORMAL LOW (ref 12.0–15.0)
MCH: 31.6 pg (ref 26.0–34.0)
MCHC: 33.3 g/dL (ref 30.0–36.0)
MCV: 94.7 fL (ref 80.0–100.0)
Platelets: 177 10*3/uL (ref 150–400)
RBC: 3.74 MIL/uL — ABNORMAL LOW (ref 3.87–5.11)
RDW: 15.3 % (ref 11.5–15.5)
WBC: 4.9 10*3/uL (ref 4.0–10.5)
nRBC: 0 % (ref 0.0–0.2)

## 2019-08-12 MED ORDER — NITROFURANTOIN MACROCRYSTAL 100 MG PO CAPS: 100.0000 mg | ORAL_CAPSULE | Freq: Two times a day (BID) | ORAL | 0 refills | Status: DC

## 2019-08-12 NOTE — ED Triage Notes (Signed)
Pt reports has been drinking and not eating since her son was killed at the beginning of October. Pt reports has been having a lot of BM, like diarrhea and last pm her BM were really black. Pt denies pain, reports concerned that the drinking may have messed her up. Pt reports last drank Wednesday.

## 2019-08-12 NOTE — ED Provider Notes (Signed)
[facility] Emergency Department Provider Note  ____________________________________________  Time seen: Approximately 1:32 PM  I have reviewed the triage vital signs and the nursing notes.   HISTORY  Chief Complaint Diarrhea and Rectal Bleeding    HPI Erika Knapp is a 53 y.o. female with a history of hypertension and hyperlipidemia who comes the ED complaining of dark black stools and diarrhea for the past several days.  She reports that she has been drinking heavily for the past 2 months after the death of her son, but last drink was 5 days ago.  Denies any withdrawal symptoms such as flushing sweating hallucinosis tremor or tachycardia.  She does feel sad, has poor sleep.  She is eating and drinking normally.  Feels that she has good support from her husband, and has seen [facility] in the past and feels comfortable following up with them as needed.  Denies SI or HI.  She does have some intermittent upper abdominal pain, not affected by eating.  Nonradiating, no aggravating or alleviating factors.      Past Medical History:  Diagnosis Date  . High cholesterol   . Hypertension   . Pulmonary embolism [facility])      Patient Active Problem List   Diagnosis Date Noted  . Seizures (HCC) 04/10/2015     Past Surgical History:  Procedure Laterality Date  . GASTRIC BYPASS    . TOTAL KNEE ARTHROPLASTY       Prior to Admission medications   Medication Sig Start Date End Date Taking? Authorizing Provider  LATUDA 120 MG TABS Take 1 tablet by mouth at bedtime. 03/27/15   [provider]  lovastatin (MEVACOR) 20 MG tablet Take 1 tablet by mouth daily. 03/27/15   [provider]  nitrofurantoin (MACRODANTIN) 100 MG capsule Take 1 capsule (100 mg total) by mouth 2 (two) times daily. 08/12/19   Sharman Cheek, MD  sertraline (ZOLOFT) 50 MG tablet Take 50 mg by mouth daily.    [provider]  triamterene-hydrochlorothiazide (MAXZIDE-25)  37.5-25 MG per tablet Take 1 tablet by mouth daily. 03/27/15   [provider]     Allergies Tramadol   Family History  Problem Relation Age of Onset  . High blood pressure Brother   . High blood pressure Sister   . Depression Brother   . Depression Sister   . Bipolar disorder Brother     Social History Social History   Tobacco Use  . Smoking status: Current Every Day Smoker  . Smokeless tobacco: Never Used  Substance Use Topics  . Alcohol use: Yes    Alcohol/week: 0.0 standard drinks    Comment: beer/day  . Drug use: Yes    Types: Marijuana    Review of Systems  Constitutional:   No fever or chills.  ENT:   No sore throat. No rhinorrhea. Cardiovascular:   No chest pain or syncope. Respiratory:   No dyspnea or cough. Gastrointestinal:   Positive for upper abdominal pain and diarrhea.  No vomiting. Musculoskeletal:   Negative for focal pain or swelling All other systems reviewed and are negative except as documented above in ROS and HPI.  ____________________________________________   PHYSICAL EXAM:  VITAL SIGNS: ED Triage Vitals  Enc Vitals Group     BP 08/12/19 1056 133/87     Pulse Rate 08/12/19 1056 92     Resp 08/12/19 1056 19     Temp 08/12/19 1100 98.3 F (36.8 C)     Temp Source 08/12/19  1056 Oral     SpO2 08/12/19 1056 100 %     Weight 08/12/19 1056 250 lb (113.4 kg)     Height 08/12/19 1056 5\' 6"  (1.676 m)     Head Circumference --      Peak Flow --      Pain Score 08/12/19 1100 0     Pain Loc --      Pain Edu? --      Excl. in GC? --     Vital signs reviewed, nursing assessments reviewed.   Constitutional:   Alert and oriented. Non-toxic appearance. Eyes:   Conjunctivae are normal. EOMI. PERRL. ENT      Head:   Normocephalic and atraumatic.      Nose:   Wearing a mask.      Mouth/Throat:   Wearing a mask.      Neck:   No meningismus. Full ROM. Hematological/Lymphatic/Immunilogical:   No cervical  lymphadenopathy. Cardiovascular:   RRR. Symmetric bilateral radial and DP pulses.  No murmurs. Cap refill less than 2 seconds. Respiratory:   Normal respiratory effort without tachypnea/retractions. Breath sounds are clear and equal bilaterally. No wheezes/rales/rhonchi. Gastrointestinal:   Soft and nontender. Non distended. There is no CVA tenderness.  No rebound, rigidity, or guarding. Rectal exam performed with nurse Selena BattenKim at bedside, shows no stool, Hemoccult negative. Musculoskeletal:   Normal range of motion in all extremities. No joint effusions.  No lower extremity tenderness.  No edema. Neurologic:   Normal speech and language.  Motor grossly intact. No acute focal neurologic deficits are appreciated.  Skin:    Skin is warm, dry and intact. No rash noted.  No petechiae, purpura, or bullae.  ____________________________________________    LABS (pertinent positives/negatives) (all labs ordered are listed, but only abnormal results are displayed) Labs Reviewed  LIPASE, BLOOD - Abnormal; Notable for the following components:      Result Value   Lipase 83 (*)    All other components within normal limits  COMPREHENSIVE METABOLIC PANEL - Abnormal; Notable for the following components:   Glucose, Bld 107 (*)    Calcium 8.0 (*)    Albumin 3.0 (*)    AST 50 (*)    Total Bilirubin 1.3 (*)    All other components within normal limits  CBC - Abnormal; Notable for the following components:   RBC 3.74 (*)    Hemoglobin 11.8 (*)    HCT 35.4 (*)    All other components within normal limits  URINALYSIS, COMPLETE (UACMP) WITH MICROSCOPIC - Abnormal; Notable for the following components:   Color, Urine AMBER (*)    APPearance CLOUDY (*)    Hgb urine dipstick SMALL (*)    Ketones, ur 5 (*)    Nitrite POSITIVE (*)    Leukocytes,Ua SMALL (*)    Bacteria, UA MANY (*)    All other components within normal limits  POC OCCULT BLOOD, ED    ____________________________________________   EKG    ____________________________________________    RADIOLOGY  No results found.  ____________________________________________   PROCEDURES Procedures  ____________________________________________    CLINICAL IMPRESSION / ASSESSMENT AND PLAN / ED COURSE  Medications ordered in the ED: Medications - No data to display  Pertinent labs & imaging results that were available during my care of the patient were reviewed by me and considered in my medical decision making (see chart for details).  Erika Knapp was evaluated in Emergency Department on 08/12/2019 for the symptoms described in the history  of present illness. She was evaluated in the context of the global COVID-19 pandemic, which necessitated consideration that the patient might be at risk for infection with the SARS-CoV-2 virus that causes COVID-19. Institutional protocols and algorithms that pertain to the evaluation of patients at risk for COVID-19 are in a state of rapid change based on information released by regulatory bodies including the CDC and federal and state organizations. These policies and algorithms were followed during the patient's care in the ED.   Patient is nontoxic, presents with diarrhea for the past several days in the setting of heavy alcohol use recently.  Vital signs are normal.  Lab panel overall unremarkable but does show some slight elevation of AST and lipase consistent with alcoholic hepatitis and pancreatitis which are likely improving and downtrending given that she has not been drinking the last 5 days.  No evidence of withdrawal syndrome.  Exam is reassuring.  Stable for discharge home.  Macrobid for 3 days due to UTI on urinalysis likely secondary to diarrhea.      ____________________________________________   FINAL CLINICAL IMPRESSION(S) / ED DIAGNOSES    Final diagnoses:  Urinary tract infection without hematuria, site  unspecified  Acute alcoholic gastritis without hemorrhage     ED Discharge Orders         Ordered    nitrofurantoin (MACRODANTIN) 100 MG capsule  2 times daily     08/12/19 1332          Portions of this note were generated with dragon dictation software. Dictation errors may occur despite best attempts at proofreading.   Carrie Mew, MD 08/12/19 1350

## 2019-08-27 IMAGING — CT CT ABD-PELV W/ CM
2 of 5 series · 15 of 46 positions shown, 17 images · IV contrast (APPLIED)
Comparison: CT of the abdomen and pelvis performed 01/20/2011, and
right upper quadrant ultrasound performed 07/01/2013

CLINICAL DATA: Acute onset of rectal bleeding and cramping lower
abdominal pain.

EXAM:
CT ABDOMEN AND PELVIS WITH CONTRAST
TECHNIQUE: Multidetector CT imaging of the abdomen and pelvis was performed
using the standard protocol following bolus administration of
intravenous contrast.
CONTRAST:  125mL GPGVLQ-CAA IOPAMIDOL (GPGVLQ-CAA) INJECTION 61%

[Series 2: axial st · axial · 0.98mm/px · z∈[-874,-449]mm · 12 of 96 slices shown, 14 images]
[im 6/96  soft-tissue]
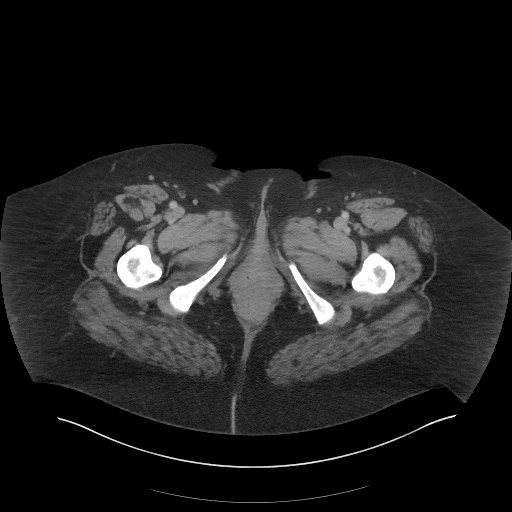
[im 6/96  bone]
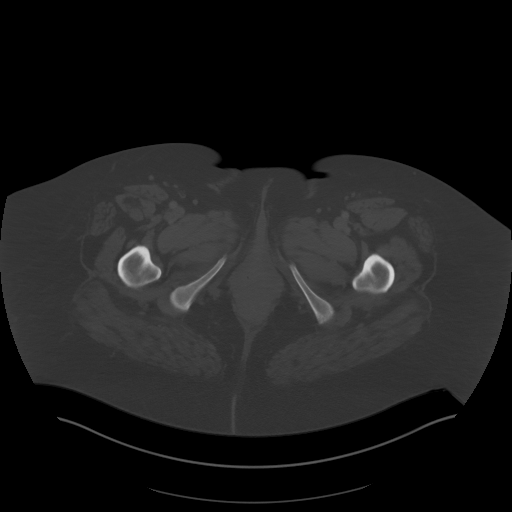
[im 16/96  soft-tissue]
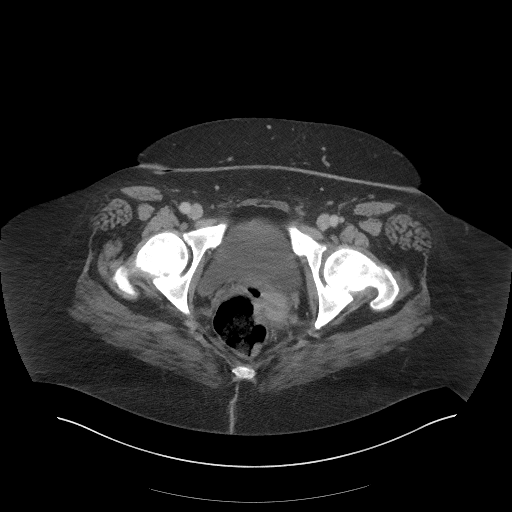
[im 21/96  soft-tissue]
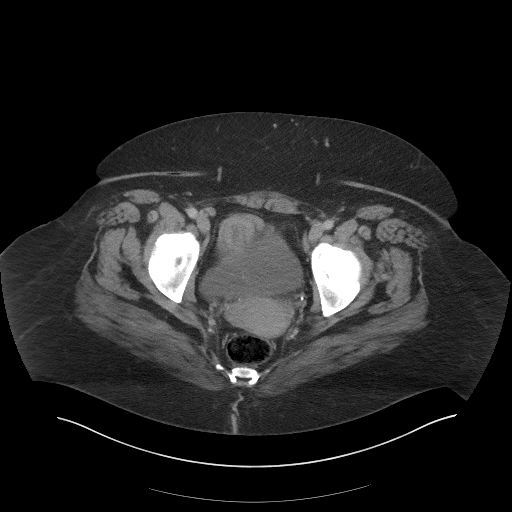
[im 31/96  soft-tissue]
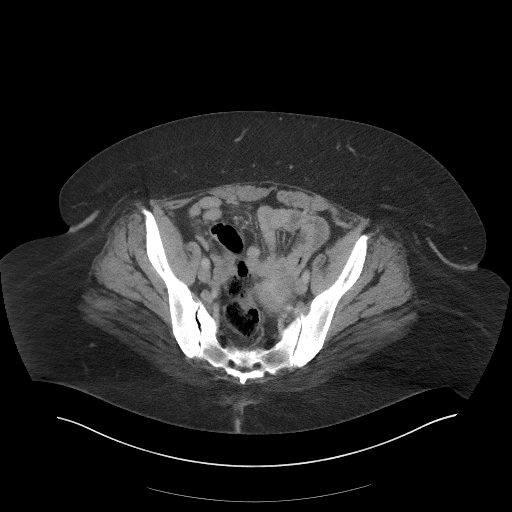
[im 36/96  soft-tissue]
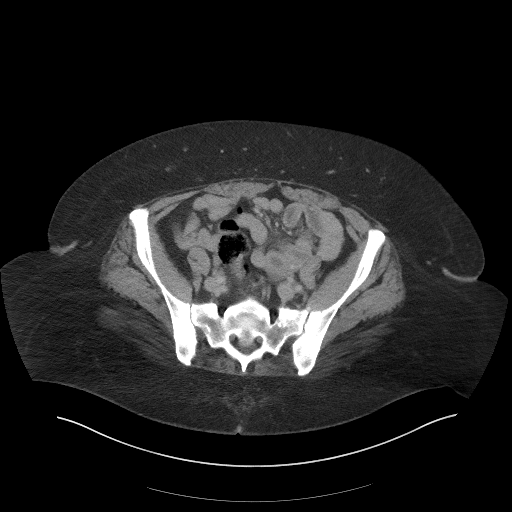
[im 46/96  soft-tissue]
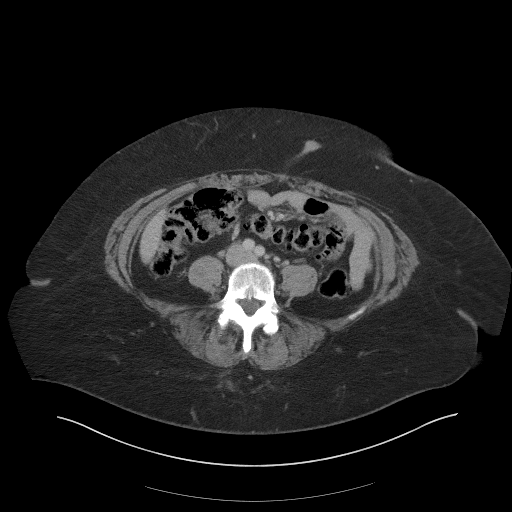
[im 51/96  soft-tissue]
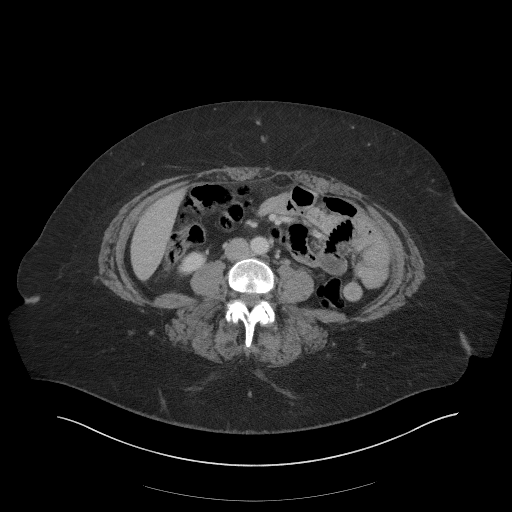
[im 61/96  soft-tissue]
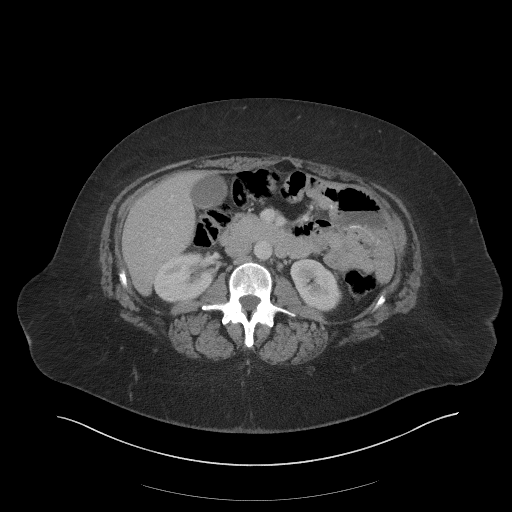
[im 66/96  soft-tissue]
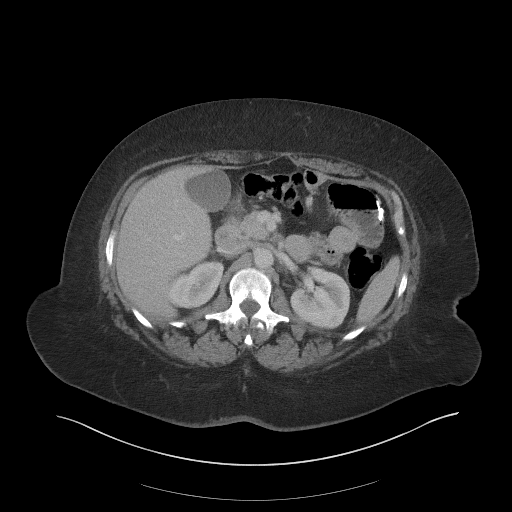
[im 66/96  bone]
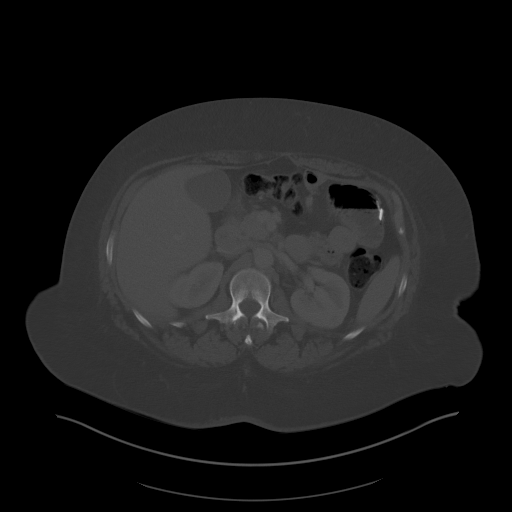
[im 76/96  soft-tissue]
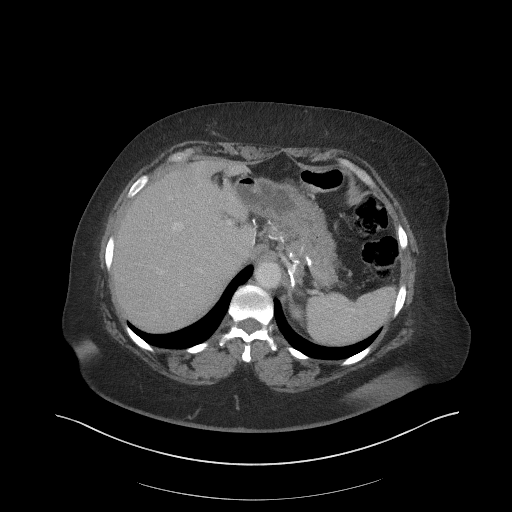
[im 81/96  soft-tissue]
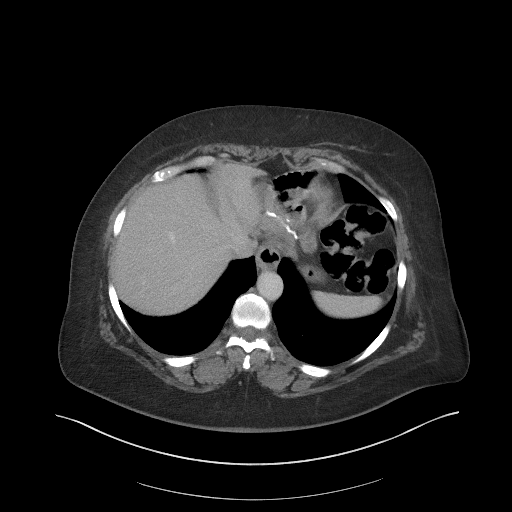
[im 91/96  soft-tissue]
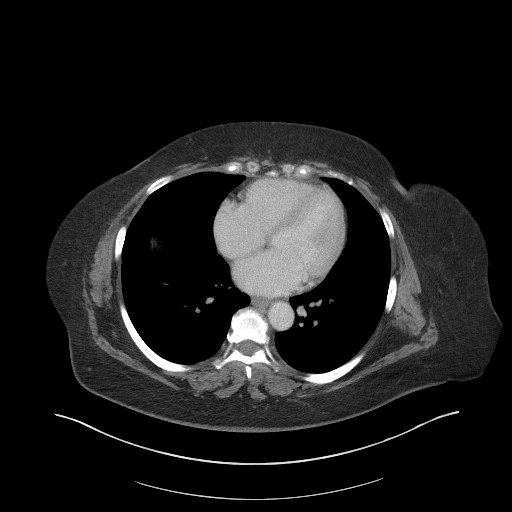

[Series 5: coronal st · coronal · 0.71mm/px · 3 of 82 slices shown]
[im 28/82  soft-tissue]
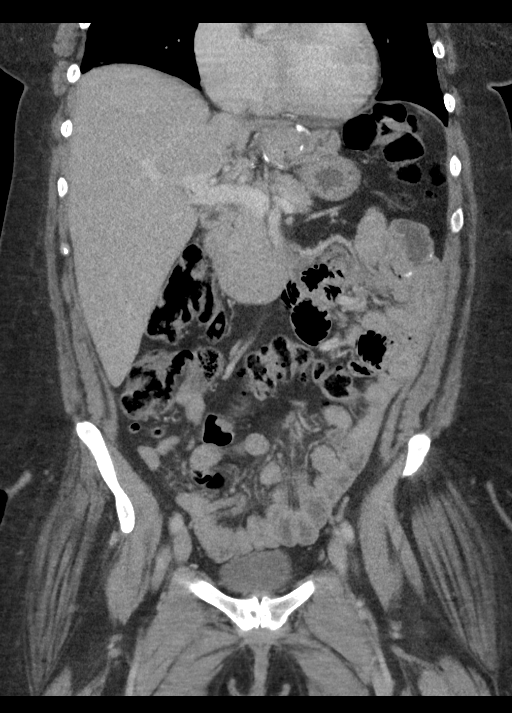
[im 37/82  soft-tissue]
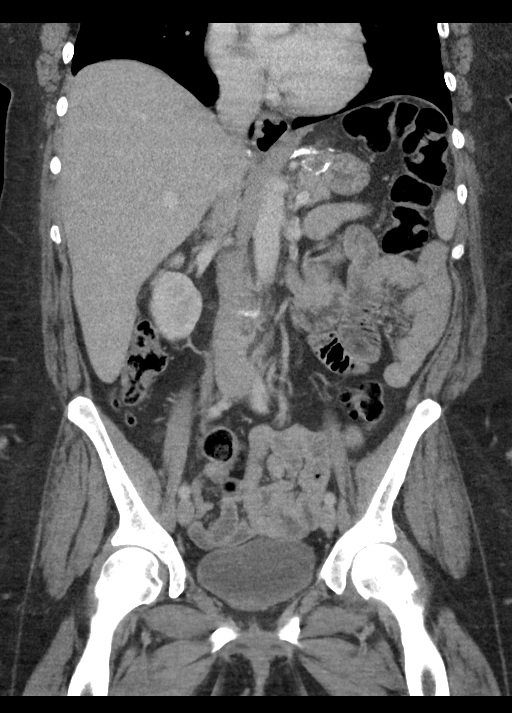
[im 46/82  soft-tissue]
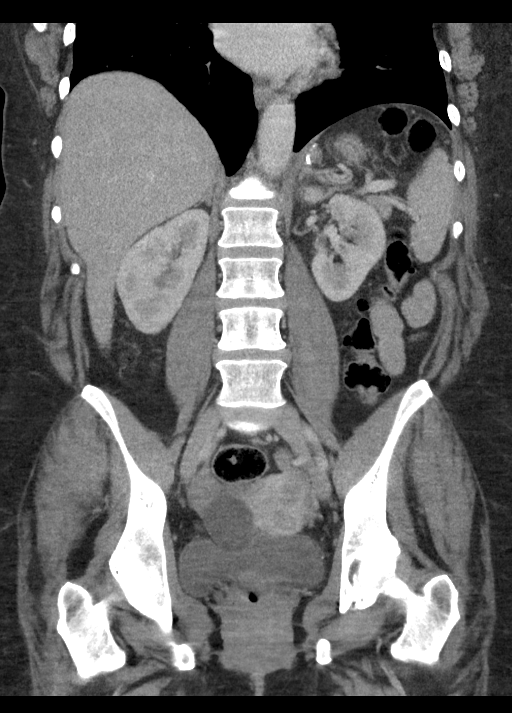

[15 of 46 positions shown; findings below may reference images not displayed]

FINDINGS: Lower chest: Minimal hazy opacity at the right lung base may reflect
atelectasis or possibly mild infection. The visualized portions of
the mediastinum are unremarkable.

Hepatobiliary: The liver is unremarkable in appearance. The
gallbladder is unremarkable in appearance. The common bile duct
remains normal in caliber.

Pancreas: The pancreas is within normal limits.

Spleen: The spleen is unremarkable in appearance.

Adrenals/Urinary Tract: The adrenal glands are unremarkable in
appearance. The kidneys are within normal limits. There is no
evidence of hydronephrosis. No renal or ureteral stones are
identified. No perinephric stranding is seen.

Stomach/Bowel: There is vague soft tissue inflammation about the
gastrojejunal anastomosis, which may reflect a mild infectious or
inflammatory process. The distal stomach is relatively decompressed
and grossly unremarkable.

The appendix is normal in caliber, without evidence of appendicitis.
The colon is grossly unremarkable in appearance. The small bowel is
unremarkable in appearance.

Vascular/Lymphatic: The abdominal aorta is unremarkable in
appearance. The inferior vena cava is grossly unremarkable. No
retroperitoneal lymphadenopathy is seen. No pelvic sidewall
lymphadenopathy is identified.

Reproductive: Vague decreased attenuation is noted within the
uterus. Underlying mass cannot be excluded. A 3.5 cm right adnexal
cystic focus is likely physiologic in nature. No suspicious adnexal
masses are seen.

Other: No additional soft tissue abnormalities are seen.

Musculoskeletal: No acute osseous abnormalities are identified.
Facet disease is noted at the lower lumbar spine. The visualized
musculature is unremarkable in appearance.
IMPRESSION: 1. Vague soft tissue inflammation about the patient's gastrojejunal
anastomosis, which may reflect a mild infectious or inflammatory
process.
2. Vague decreased attenuation noted within the uterus. Underlying
mass cannot be excluded. Pelvic ultrasound would be helpful for
further evaluation, when and as deemed clinically appropriate.
3. Minimal hazy opacity at the right lung base may reflect
atelectasis or possibly mild infection.

## 2019-09-23 ENCOUNTER — Other Ambulatory Visit: Payer: Self-pay

## 2019-09-23 ENCOUNTER — Emergency Department
Admission: EM | Admit: 2019-09-23 | Discharge: 2019-09-23 | Disposition: A | Discharge status: Home / Self Care | Payer: Medicare Other | Attending: Emergency Medicine | Admitting: Emergency Medicine

## 2019-09-23 ENCOUNTER — Encounter: Payer: Self-pay | Admitting: Emergency Medicine

## 2019-09-23 DIAGNOSIS — Z96659 Presence of unspecified artificial knee joint: Secondary | ICD-10-CM | POA: Diagnosis not present

## 2019-09-23 DIAGNOSIS — Z9884 Bariatric surgery status: Secondary | ICD-10-CM | POA: Diagnosis not present

## 2019-09-23 DIAGNOSIS — Z79899 Other long term (current) drug therapy: Secondary | ICD-10-CM | POA: Insufficient documentation

## 2019-09-23 DIAGNOSIS — F17210 Nicotine dependence, cigarettes, uncomplicated: Secondary | ICD-10-CM | POA: Diagnosis not present

## 2019-09-23 DIAGNOSIS — I10 Essential (primary) hypertension: Secondary | ICD-10-CM | POA: Diagnosis not present

## 2019-09-23 DIAGNOSIS — L0390 Cellulitis, unspecified: Secondary | ICD-10-CM

## 2019-09-23 DIAGNOSIS — R198 Other specified symptoms and signs involving the digestive system and abdomen: Secondary | ICD-10-CM | POA: Diagnosis present

## 2019-09-23 DIAGNOSIS — L03316 Cellulitis of umbilicus: Secondary | ICD-10-CM | POA: Insufficient documentation

## 2019-09-23 LAB — CBC WITH DIFFERENTIAL/PLATELET
Abs Immature Granulocytes: 0.02 10*3/uL (ref 0.00–0.07)
Basophils Absolute: 0 10*3/uL (ref 0.0–0.1)
Basophils Relative: 0 %
Eosinophils Absolute: 0.1 10*3/uL (ref 0.0–0.5)
Eosinophils Relative: 1 %
HCT: 36.4 % (ref 36.0–46.0)
Hemoglobin: 12.1 g/dL (ref 12.0–15.0)
Immature Granulocytes: 0 %
Lymphocytes Relative: 20 %
Lymphs Abs: 1 10*3/uL (ref 0.7–4.0)
MCH: 31.8 pg (ref 26.0–34.0)
MCHC: 33.2 g/dL (ref 30.0–36.0)
MCV: 95.5 fL (ref 80.0–100.0)
Monocytes Absolute: 0.3 10*3/uL (ref 0.1–1.0)
Monocytes Relative: 6 %
Neutro Abs: 3.4 10*3/uL (ref 1.7–7.7)
Neutrophils Relative %: 73 %
Platelets: 257 10*3/uL (ref 150–400)
RBC: 3.81 MIL/uL — ABNORMAL LOW (ref 3.87–5.11)
RDW: 14.6 % (ref 11.5–15.5)
WBC: 4.7 10*3/uL (ref 4.0–10.5)
nRBC: 0 % (ref 0.0–0.2)

## 2019-09-23 LAB — COMPREHENSIVE METABOLIC PANEL
ALT: 24 U/L (ref 0–44)
AST: 50 U/L — ABNORMAL HIGH (ref 15–41)
Albumin: 3 g/dL — ABNORMAL LOW (ref 3.5–5.0)
Alkaline Phosphatase: 92 U/L (ref 38–126)
Anion gap: 12 (ref 5–15)
BUN: 8 mg/dL (ref 6–20)
CO2: 27 mmol/L (ref 22–32)
Calcium: 8.2 mg/dL — ABNORMAL LOW (ref 8.9–10.3)
Chloride: 98 mmol/L (ref 98–111)
Creatinine, Ser: 0.84 mg/dL (ref 0.44–1.00)
GFR calc Af Amer: >60 mL/min (ref >60)
GFR calc non Af Amer: >60 mL/min (ref >60)
Glucose, Bld: 110 mg/dL — ABNORMAL HIGH (ref 70–99)
Potassium: 4 mmol/L (ref 3.5–5.1)
Sodium: 137 mmol/L (ref 135–145)
Total Bilirubin: 1.9 mg/dL — ABNORMAL HIGH (ref 0.3–1.2)
Total Protein: 7.2 g/dL (ref 6.5–8.1)

## 2019-09-23 LAB — LACTIC ACID, PLASMA: Lactic Acid, Venous: 1.5 mmol/L (ref 0.5–1.9)

## 2019-09-23 MED ORDER — CEPHALEXIN 500 MG PO CAPS: 500.0000 mg | ORAL_CAPSULE | Freq: Two times a day (BID) | ORAL | 0 refills | Status: DC

## 2019-09-23 NOTE — ED Provider Notes (Signed)
[facility] Emergency Department Provider Note   ____________________________________________    I have reviewed the triage vital signs and the nursing notes.   HISTORY  Chief Complaint Wound Infection     HPI Erika Knapp is a 53 y.o. female who presents with complaints of discharge from her umbilicus.  She reports small amount of yellowish discharge for approximately 2 weeks.  She has been putting hydrogen peroxide on it and reports it stings somewhat.  No nausea or vomiting or abdominal pain.  Has not taken any other medications for this.  No history of this in the past.  No history of abdominal surgery  Past Medical History:  Diagnosis Date  . High cholesterol   . Hypertension   . Pulmonary embolism [facility])     Patient Active Problem List   Diagnosis Date Noted  . Seizures (HCC) 04/10/2015    Past Surgical History:  Procedure Laterality Date  . GASTRIC BYPASS    . TOTAL KNEE ARTHROPLASTY      Prior to Admission medications   Medication Sig Start Date End Date Taking? Authorizing Provider  cephALEXin (KEFLEX) 500 MG capsule Take 1 capsule (500 mg total) by mouth 2 (two) times daily. 09/23/19   Jene Every, MD  LATUDA 120 MG TABS Take 1 tablet by mouth at bedtime. 03/27/15   [provider]  lovastatin (MEVACOR) 20 MG tablet Take 1 tablet by mouth daily. 03/27/15   [provider]  nitrofurantoin (MACRODANTIN) 100 MG capsule Take 1 capsule (100 mg total) by mouth 2 (two) times daily. 08/12/19   Sharman Cheek, MD  sertraline (ZOLOFT) 50 MG tablet Take 50 mg by mouth daily.    [provider]  triamterene-hydrochlorothiazide (MAXZIDE-25) 37.5-25 MG per tablet Take 1 tablet by mouth daily. 03/27/15   [provider]     Allergies Tramadol  Family History  Problem Relation Age of Onset  . High blood pressure Brother   . High blood pressure Sister   . Depression Brother   . Depression Sister    . Bipolar disorder Brother     Social History Social History   Tobacco Use  . Smoking status: Current Every Day Smoker  . Smokeless tobacco: Never Used  Substance Use Topics  . Alcohol use: Yes    Alcohol/week: 0.0 standard drinks    Comment: beer/day  . Drug use: Yes    Types: Marijuana    Review of Systems  Constitutional: No fever/chills Eyes: No visual changes.  ENT: No sore throat. Cardiovascular: Denies chest pain. Respiratory: Denies shortness of breath. Gastrointestinal: As above Genitourinary: Negative for dysuria. Musculoskeletal: Negative for back pain. Skin: Negative for rash. Neurological: Negative for headaches   ____________________________________________   PHYSICAL EXAM:  VITAL SIGNS: ED Triage Vitals  Enc Vitals Group     BP 09/23/19 0728 (!) 131/115     Pulse Rate 09/23/19 0728 90     Resp 09/23/19 0728 20     Temp 09/23/19 0728 98.8 F (37.1 C)     Temp Source 09/23/19 0728 Oral     SpO2 09/23/19 0728 100 %     Weight 09/23/19 0724 117.9 kg (260 lb)     Height 09/23/19 0724 1.676 m (5\' 6" )     Head Circumference --      Peak Flow --      Pain Score 09/23/19 0723 7     Pain Loc --      Pain Edu? --  Excl. in [place]? --     Constitutional: Alert and oriented. e Nose: No congestion/rhinnorhea. Mouth/Throat: Mucous membranes are moist.   Neck:  Painless ROM Cardiovascular: Normal rate, regular rhythm. Grossly normal heart sounds.  Good peripheral circulation. Respiratory: Normal respiratory effort.  No retractions. Lungs CTAB. Gastrointestinal: Soft and nontender. No distention.  No CVA tenderness.  Reassuring exam.  Mild irritation to the umbilicus, probed with sterile Q-tips no significant structural abnormalities  Musculoskeletal:   Warm and well perfused Neurologic:  Normal speech and language. No gross focal neurologic deficits are appreciated.  Skin:  Skin is warm, dry and intact. No rash noted. Psychiatric: Mood and affect are  normal. Speech and behavior are normal.  ____________________________________________   LABS (all labs ordered are listed, but only abnormal results are displayed)  Labs Reviewed  COMPREHENSIVE METABOLIC PANEL - Abnormal; Notable for the following components:      Result Value   Glucose, Bld 110 (*)    Calcium 8.2 (*)    Albumin 3.0 (*)    AST 50 (*)    Total Bilirubin 1.9 (*)    All other components within normal limits  CBC WITH DIFFERENTIAL/PLATELET - Abnormal; Notable for the following components:   RBC 3.81 (*)    All other components within normal limits  LACTIC ACID, PLASMA   ____________________________________________  EKG  None ____________________________________________  RADIOLOGY  None ____________________________________________   PROCEDURES  Procedure(s) performed: No  Procedures   Critical Care performed: No ____________________________________________   INITIAL IMPRESSION / ASSESSMENT AND PLAN / ED COURSE  Pertinent labs & imaging results that were available during my care of the patient were reviewed by me and considered in my medical decision making (see chart for details).  Patient well-appearing in no acute distress, abdominal exam is reassuring, lab work is unremarkable.  Suspect mild irritation inflammation/minimal cellulitis, will cover with antibiotics, outpatient follow-up as needed.    ____________________________________________   FINAL CLINICAL IMPRESSION(S) / ED DIAGNOSES  Final diagnoses:  Cellulitis, unspecified cellulitis site        Note:  This document was prepared using Dragon voice recognition software and may include unintentional dictation errors.   Lavonia Drafts, MD 09/23/19 1535

## 2019-09-23 NOTE — ED Triage Notes (Signed)
Pt reports has some abd pain and pus coming out of her belly button that has a foul odor for the past 2 weeks. Pt denies fevers.

## 2019-09-23 NOTE — ED Triage Notes (Signed)
Pt reports the pain starts in her belly button and travels down her abd and is sharp in nature. Pt has belly button stuffed with tissues currently. Pt reports the pus like discharge has a really bad odor.

## 2019-10-08 ENCOUNTER — Ambulatory Visit: Payer: Medicare Other | Attending: Internal Medicine

## 2019-10-08 DIAGNOSIS — Z20822 Contact with and (suspected) exposure to covid-19: Secondary | ICD-10-CM

## 2019-10-09 LAB — NOVEL CORONAVIRUS, NAA: SARS-CoV-2, NAA: NOT DETECTED

## 2019-12-30 ENCOUNTER — Encounter: Payer: Self-pay | Admitting: Emergency Medicine

## 2019-12-30 ENCOUNTER — Emergency Department: Payer: Medicare Other

## 2019-12-30 ENCOUNTER — Other Ambulatory Visit: Payer: Self-pay

## 2019-12-30 ENCOUNTER — Emergency Department
Admission: EM | Admit: 2019-12-30 | Discharge: 2019-12-30 | Disposition: A | Discharge status: Home / Self Care | Payer: Medicare Other | Attending: Emergency Medicine | Admitting: Emergency Medicine

## 2019-12-30 DIAGNOSIS — F4329 Adjustment disorder with other symptoms: Secondary | ICD-10-CM | POA: Insufficient documentation

## 2019-12-30 DIAGNOSIS — R102 Pelvic and perineal pain: Secondary | ICD-10-CM | POA: Diagnosis not present

## 2019-12-30 DIAGNOSIS — I10 Essential (primary) hypertension: Secondary | ICD-10-CM | POA: Diagnosis not present

## 2019-12-30 DIAGNOSIS — R531 Weakness: Secondary | ICD-10-CM | POA: Diagnosis not present

## 2019-12-30 DIAGNOSIS — F332 Major depressive disorder, recurrent severe without psychotic features: Secondary | ICD-10-CM | POA: Insufficient documentation

## 2019-12-30 DIAGNOSIS — Z9884 Bariatric surgery status: Secondary | ICD-10-CM | POA: Diagnosis not present

## 2019-12-30 DIAGNOSIS — F4321 Adjustment disorder with depressed mood: Secondary | ICD-10-CM

## 2019-12-30 DIAGNOSIS — Z86711 Personal history of pulmonary embolism: Secondary | ICD-10-CM | POA: Insufficient documentation

## 2019-12-30 DIAGNOSIS — R42 Dizziness and giddiness: Secondary | ICD-10-CM | POA: Diagnosis present

## 2019-12-30 DIAGNOSIS — F17210 Nicotine dependence, cigarettes, uncomplicated: Secondary | ICD-10-CM | POA: Insufficient documentation

## 2019-12-30 DIAGNOSIS — R1900 Intra-abdominal and pelvic swelling, mass and lump, unspecified site: Secondary | ICD-10-CM

## 2019-12-30 DIAGNOSIS — F1210 Cannabis abuse, uncomplicated: Secondary | ICD-10-CM | POA: Diagnosis not present

## 2019-12-30 LAB — COMPREHENSIVE METABOLIC PANEL
ALT: 18 U/L (ref 0–44)
AST: 39 U/L (ref 15–41)
Albumin: 3.8 g/dL (ref 3.5–5.0)
Alkaline Phosphatase: 65 U/L (ref 38–126)
Anion gap: 11 (ref 5–15)
BUN: 10 mg/dL (ref 6–20)
CO2: 26 mmol/L (ref 22–32)
Calcium: 8.3 mg/dL — ABNORMAL LOW (ref 8.9–10.3)
Chloride: 102 mmol/L (ref 98–111)
Creatinine, Ser: 0.93 mg/dL (ref 0.44–1.00)
GFR calc Af Amer: >60 mL/min (ref >60)
GFR calc non Af Amer: >60 mL/min (ref >60)
Glucose, Bld: 118 mg/dL — ABNORMAL HIGH (ref 70–99)
Potassium: 4 mmol/L (ref 3.5–5.1)
Sodium: 139 mmol/L (ref 135–145)
Total Bilirubin: 1.3 mg/dL — ABNORMAL HIGH (ref 0.3–1.2)
Total Protein: 7.8 g/dL (ref 6.5–8.1)

## 2019-12-30 LAB — URINALYSIS, COMPLETE (UACMP) WITH MICROSCOPIC
Bilirubin Urine: NEGATIVE
Glucose, UA: NEGATIVE mg/dL
Ketones, ur: 5 mg/dL — AB
Leukocytes,Ua: NEGATIVE
Nitrite: NEGATIVE
Protein, ur: NEGATIVE mg/dL
Specific Gravity, Urine: 1.018 (ref 1.005–1.030)
pH: 6 (ref 5.0–8.0)

## 2019-12-30 LAB — CBC WITH DIFFERENTIAL/PLATELET
Abs Immature Granulocytes: 0.02 10*3/uL (ref 0.00–0.07)
Basophils Absolute: 0 10*3/uL (ref 0.0–0.1)
Basophils Relative: 0 %
Eosinophils Absolute: 0 10*3/uL (ref 0.0–0.5)
Eosinophils Relative: 1 %
HCT: 36.4 % (ref 36.0–46.0)
Hemoglobin: 11.8 g/dL — ABNORMAL LOW (ref 12.0–15.0)
Immature Granulocytes: 0 %
Lymphocytes Relative: 15 %
Lymphs Abs: 0.9 10*3/uL (ref 0.7–4.0)
MCH: 29.6 pg (ref 26.0–34.0)
MCHC: 32.4 g/dL (ref 30.0–36.0)
MCV: 91.2 fL (ref 80.0–100.0)
Monocytes Absolute: 0.3 10*3/uL (ref 0.1–1.0)
Monocytes Relative: 5 %
Neutro Abs: 4.7 10*3/uL (ref 1.7–7.7)
Neutrophils Relative %: 79 %
Platelets: 286 10*3/uL (ref 150–400)
RBC: 3.99 MIL/uL (ref 3.87–5.11)
RDW: 13.8 % (ref 11.5–15.5)
WBC: 5.9 10*3/uL (ref 4.0–10.5)
nRBC: 0 % (ref 0.0–0.2)

## 2019-12-30 LAB — POCT PREGNANCY, URINE: Preg Test, Ur: NEGATIVE

## 2019-12-30 LAB — TROPONIN I (HIGH SENSITIVITY)
Troponin I (High Sensitivity): 30 ng/L — ABNORMAL HIGH (ref <18)
Troponin I (High Sensitivity): 29 ng/L — ABNORMAL HIGH (ref <18)

## 2019-12-30 MED ORDER — DIAZEPAM 5 MG PO TABS
10.0000 mg | ORAL_TABLET | Freq: Once | ORAL | Status: AC
  Administered 2019-12-30: 10 mg via ORAL
  Filled 2019-12-30: qty 2

## 2019-12-30 MED ORDER — SODIUM CHLORIDE 0.9 % IV SOLN: Freq: Once | INTRAVENOUS | Status: AC

## 2019-12-30 NOTE — ED Notes (Signed)
Pt denies any thoughts/intentions to harm self. Daughter remains at bedside.

## 2019-12-30 NOTE — ED Provider Notes (Signed)
[facility] Emergency Department Provider Note       Time seen: ----------------------------------------- 10:34 AM on 12/30/2019 -----------------------------------------  I have reviewed the triage vital signs and the nursing notes.  HISTORY   Chief Complaint No chief complaint on file.    HPI Erika Knapp is a 54 y.o. female with a history of hyperlipidemia, hypertension, PE, seizures, depression who presents to the ED for near syncope.  Patient states she had several near syncopal events at home.  Reportedly she has not been taking care of herself very well since her son died in 23-Jul-2023.  She is not eating well, drinking well or sleeping well.  Patient is very depressed and tearful.  Past Medical History:  Diagnosis Date  . High cholesterol   . Hypertension   . Pulmonary embolism [facility]     Patient Active Problem List   Diagnosis Date Noted  . Seizures (HCC) 04/10/2015    Past Surgical History:  Procedure Laterality Date  . GASTRIC BYPASS    . TOTAL KNEE ARTHROPLASTY      Allergies Tramadol  Social History Social History   Tobacco Use  . Smoking status: Current Every Day Smoker  . Smokeless tobacco: Never Used  Substance Use Topics  . Alcohol use: Yes    Alcohol/week: 0.0 standard drinks    Comment: beer/day  . Drug use: Yes    Types: Marijuana    Review of Systems Constitutional: Negative for fever. Cardiovascular: Negative for chest pain. Respiratory: Negative for shortness of breath. Gastrointestinal: Negative for abdominal pain, vomiting and diarrhea. Musculoskeletal: Negative for back pain. Skin: Negative for rash. Neurological: Negative for headaches, focal weakness or numbness. Psychiatric: Positive for depression, anxiety All systems negative/normal/unremarkable except as stated in the HPI  ____________________________________________   PHYSICAL EXAM:  VITAL SIGNS: ED Triage Vitals  Enc Vitals Group   BP      Pulse      Resp      Temp      Temp src      SpO2      Weight      Height      Head Circumference      Peak Flow      Pain Score      Pain Loc      Pain Edu?      Excl. in GC?     Constitutional: Alert and oriented.  Anxious and tearful, no distress Eyes: Conjunctivae are normal. Normal extraocular movements. ENT      Head: Normocephalic and atraumatic.      Nose: No congestion/rhinnorhea.      Mouth/Throat: Mucous membranes are moist.      Neck: No stridor. Cardiovascular: Normal rate, regular rhythm. No murmurs, rubs, or gallops. Respiratory: Normal respiratory effort without tachypnea nor retractions. Breath sounds are clear and equal bilaterally. No wheezes/rales/rhonchi. Gastrointestinal: Soft and nontender. Normal bowel sounds Musculoskeletal: Nontender with normal range of motion in extremities. No lower extremity tenderness nor edema. Neurologic:  Normal speech and language. No gross focal neurologic deficits are appreciated.  Skin:  Skin is warm, dry and intact. No rash noted. Psychiatric: Depressed mood and affect ____________________________________________  EKG: Interpreted by me.  Sinus rhythm with rate 84 bpm, normal PR interval, normal QRS, normal QT  ____________________________________________  ED COURSE:  As part of my medical decision making, I reviewed the following data within the electronic MEDICAL RECORD NUMBER History obtained from family if available, nursing notes, old chart and ekg, as  well as notes from prior ED visits. Patient presented for near syncope and depression, we will assess with labs and imaging as indicated at this time.   Procedures  Erika Knapp was evaluated in Emergency Department on 12/30/2019 for the symptoms described in the history of present illness. She was evaluated in the context of the global COVID-19 pandemic, which necessitated consideration that the patient might be at risk for infection with the SARS-CoV-2  virus that causes COVID-19. Institutional protocols and algorithms that pertain to the evaluation of patients at risk for COVID-19 are in a state of rapid change based on information released by regulatory bodies including the CDC and federal and state organizations. These policies and algorithms were followed during the patient's care in the ED.  ____________________________________________   LABS (pertinent positives/negatives)  Labs Reviewed  CBC WITH DIFFERENTIAL/PLATELET - Abnormal; Notable for the following components:      Result Value   Hemoglobin 11.8 (*)    All other components within normal limits  COMPREHENSIVE METABOLIC PANEL - Abnormal; Notable for the following components:   Glucose, Bld 118 (*)    Calcium 8.3 (*)    Total Bilirubin 1.3 (*)    All other components within normal limits  URINALYSIS, COMPLETE (UACMP) WITH MICROSCOPIC - Abnormal; Notable for the following components:   Color, Urine YELLOW (*)    APPearance HAZY (*)    Hgb urine dipstick SMALL (*)    Ketones, ur 5 (*)    Bacteria, UA RARE (*)    All other components within normal limits  TROPONIN I (HIGH SENSITIVITY) - Abnormal; Notable for the following components:   Troponin I (High Sensitivity) 30 (*)    All other components within normal limits  TROPONIN I (HIGH SENSITIVITY) - Abnormal; Notable for the following components:   Troponin I (High Sensitivity) 29 (*)    All other components within normal limits  POC URINE PREG, ED  POCT PREGNANCY, URINE   ____________________________________________   DIFFERENTIAL DIAGNOSIS   Depression, anxiety, medication noncompliance, dehydration, electrolyte abnormality, arrhythmia  FINAL ASSESSMENT AND PLAN  Near syncope, depression   Plan: The patient had presented for near syncopal events in the setting of grief from the loss of her son 6 months ago. Patient's labs revealed a slightly elevated troponin which was not increasing and is likely chronic.  She was  evaluated by psychiatry and is cleared for outpatient follow-up.   Erika Aly, MD    Note: This note was generated in part or whole with voice recognition software. Voice recognition is usually quite accurate but there are transcription errors that can and very often do occur. I apologize for any typographical errors that were not detected and corrected.     Earleen Newport, MD 12/30/19 [id number]

## 2019-12-30 NOTE — ED Notes (Signed)
Pt up to bedside toilet attempting to provide urine sample. NS bag has about 400cc left to run. Daughter at bedside.

## 2019-12-30 NOTE — BH Assessment (Addendum)
Assessment Note  Erika Knapp is an 54 y.o. female. She presents with worsening symptoms of depression. States that her son was killed, Oct 2020. She reports thinking about her son a lot and having difficult accepting his death. Patient tearful during today's assessment. She describes on-going depressive symptoms such as helplessness, loss of interest in usual pleasures, guilt, and worthlessness. Patient denies thoughts of suicide. She has made 1 prior suicide attempt approximatley. 30 yrs ago. States, "I didn't have any help with my children and I was overwhelmed". She was hospitalized for 1 day at a facility in [place], [place]. She doesn't recall the name of the facility but states the facility is gone. She denies self mutilating behaviors. Her two biggest stressors at this time is grief and raising her deceased sons #2 children. States that taking care of the children can be a lot of work for her. Denies HI. Patient is calm and cooperative. No AVHs. She does not appear to be responding to internal stimuli. Patient reports trying marijuana when she was in her 81's. No current substance use reported. She currently has a psychiatrist and refers to her as "[name]". She is current prescribed anti-depressive and is compliant with her medications. She also receives therapist from a provider at "[facility]" but doesn't recall the name of the therapist. She lives with her spouse and 2 grandchildren. Her spouse and daughter are her support systems. No history of abuse and/or trauma.   Patient is oriented to person, place, time and situation. Affect is depressed and sad. Speech is normal. Eye contact is goo.   Diagnosis: Major Depressive Disorder, Recurrent, Severe, without psychotic features.   Past Medical History:  Past Medical History:  Diagnosis Date  . High cholesterol   . Hypertension   . Pulmonary embolism [facility])     Past Surgical History:  Procedure Laterality Date  . GASTRIC  BYPASS    . TOTAL KNEE ARTHROPLASTY      Family History:  Family History  Problem Relation Age of Onset  . High blood pressure Brother   . High blood pressure Sister   . Depression Brother   . Depression Sister   . Bipolar disorder Brother     Social History:  reports that she has been smoking. She has never used smokeless tobacco. She reports current alcohol use. She reports current drug use. Drug: Marijuana.  Additional Social History:     CIWA: CIWA-Ar BP: (!) 167/83 Pulse Rate: 81 COWS:    Allergies:  Allergies  Allergen Reactions  . Tramadol Hives    Other reaction(s): HIVES    Home Medications: (Not in a hospital admission)   OB/GYN Status:  Patient's last menstrual period was 11/10/2016.  General Assessment Data TTS Assessment: In system Is this a Tele or Face-to-Face Assessment?: Tele Assessment Is this an Initial Assessment or a Re-assessment for this encounter?: Initial Assessment Patient Accompanied by:: (EMS) Language Other than English: No Living Arrangements: (spouse and 2 grandchildren) What gender do you identify as?: Female Marital status: Married Artist name: Therapist, occupational ) Pregnancy Status: No Living Arrangements: Spouse/significant other, Children(spouse and grandchildren) Can pt return to current living arrangement?: Yes Admission Status: Voluntary(EMS called by sister) Is patient capable of signing voluntary admission?: Yes Referral Source: Self/Family/Friend Insurance type: (Medicaid and Medicare )     Crisis Care Plan Living Arrangements: Spouse/significant other, Children(spouse and grandchildren) Legal Guardian: (no legal guardian ) Name of Psychiatrist: [name]"; "I don't know her last name") [facility]ist[facility] )  Education Status Is patient currently in school?: No  Risk to self with the past 6 months Suicidal Ideation: No Has patient been a risk to self within the past 6 months prior to admission? :  No Suicidal Intent: No Has patient had any suicidal intent within the past 6 months prior to admission? : No Is patient at risk for suicide?: No Suicidal Plan?: No Has patient had any suicidal plan within the past 6 months prior to admission? : No Access to Means: No What has been your use of drugs/alcohol within the last 12 months?: ("I use to smoke marijuana but I stopped") Previous Attempts/Gestures: Yes How many times?: (1x- overdose; 30 yrs ago) Other Self Harm Risks: (denies ) Triggers for Past Attempts: ("I didn't have help raising my children") Intentional Self Injurious Behavior: (denies ) Family Suicide History: No Recent stressful life event(s): (sons death 11-Jul-2019; taking grandchildren of deceased so) Depression: Yes Depression Symptoms: Tearfulness, Isolating, Guilt, Loss of interest in usual pleasures, Feeling worthless/self pity Substance abuse history and/or treatment for substance abuse?: No Suicide prevention information given to non-admitted patients: Not applicable  Risk to Others within the past 6 months Homicidal Ideation: No Does patient have any lifetime risk of violence toward others beyond the six months prior to admission? : No Thoughts of Harm to Others: No Current Homicidal Intent: No Current Homicidal Plan: No Access to Homicidal Means: No Identified Victim: (n/a) History of harm to others?: No Assessment of Violence: None Noted Violent Behavior Description: (patient currently calm and cooperative ) Does patient have access to weapons?: No Criminal Charges Pending?: No Does patient have a court date: No Is patient on probation?: No  Psychosis Hallucinations: None noted Delusions: None noted  Mental Status Report Appearance/Hygiene: Disheveled Eye Contact: Good Motor Activity: Freedom of movement Speech: Logical/coherent Level of Consciousness: Alert Mood: Depressed Affect: Appropriate to circumstance Anxiety Level: None Thought  Processes: Relevant Judgement: Impaired Orientation: Person, Place, Time, Situation Obsessive Compulsive Thoughts/Behaviors: None  Cognitive Functioning Concentration: Decreased Memory: Recent Intact, Remote Intact Is patient IDD: No Insight: Poor Appetite: Poor Have you had any weight changes? : Loss Amount of the weight change? (lbs): (25 pounds since Jul 11, 2019) Sleep: Decreased Total Hours of Sleep: (3 hrs per night ) Vegetative Symptoms: None  ADLScreening [facility] Assessment Services) Patient's cognitive ability adequate to safely complete daily activities?: Yes Patient able to express need for assistance with ADLs?: Yes Independently performs ADLs?: Yes (appropriate for developmental age)  Prior Inpatient Therapy Prior Inpatient Therapy: Yes(daughter and husband) Prior Therapy Dates: (30 yrs ago ) Prior Therapy Facilty/Provider(s): ("A hospital in [place] but I don't remember the name") Reason for Treatment: ("I tried to hurt myself"; suicide attempt )  Prior Outpatient Therapy Prior Outpatient Therapy: Yes Prior Therapy Dates: (current ) Prior Therapy Facilty/Provider(s): (pt has a therapist and psychiatrist ) Reason for Treatment: (depression ) Does patient have an ACCT team?: No Does patient have Intensive In-House Services?  : No Does patient have [facility] services? : No Does patient have P4CC services?: No  ADL Screening (condition at time of admission) Patient's cognitive ability adequate to safely complete daily activities?: Yes Is the patient deaf or have difficulty hearing?: No Does the patient have difficulty seeing, even when wearing glasses/contacts?: No Does the patient have difficulty concentrating, remembering, or making decisions?: No Patient able to express need for assistance with ADLs?: Yes Does the patient have difficulty dressing or bathing?: No Independently performs ADLs?: Yes (appropriate for  developmental age) Does the patient have  difficulty walking or climbing stairs?: No Weakness of Legs: None Weakness of Arms/Hands: None  Home Assistive Devices/Equipment Home Assistive Devices/Equipment: None  Therapy Consults (therapy consults require a physician order) PT Evaluation Needed: No OT Evalulation Needed: No SLP Evaluation Needed: No Abuse/Neglect Assessment (Assessment to be complete while patient is alone) Abuse/Neglect Assessment Can Be Completed: Yes Physical Abuse: Denies Verbal Abuse: Denies Sexual Abuse: Denies Exploitation of patient/patient's resources: Denies Self-Neglect: Denies Values / Beliefs Cultural Requests During Hospitalization: None Spiritual Requests During Hospitalization: None   Advance Directives (For Healthcare) Does Patient Have a Medical Advance Directive?: No Nutrition Screen- [facility] Adult/WL/AP Patient's home diet: Regular Has the patient recently lost weight without trying?: No Has the patient been eating poorly because of a decreased appetite?: No Malnutrition Screening Tool Score: 0        Disposition: Per Dr. Donald Pore, patient is psych cleared. Ok to discharge home. Patient to follow up with current provider(s): therapist and psychiatrist. Patient also referred to [facility] for Psych IOP.  Disposition Initial Assessment Completed for this Encounter: Yes Patient referred to: Other (Comment)(Additional referrals given including [facility] for IOP)  On Site Evaluation by:   Reviewed with Physician:    Waldon Merl 12/30/2019 2:49 PM

## 2019-12-30 NOTE — ED Notes (Signed)
Pt denies any thoughts/intentions to harm self.

## 2019-12-30 NOTE — ED Notes (Signed)
Pt leaving for US.

## 2019-12-30 NOTE — ED Notes (Signed)
Consulting staff at bedside.  

## 2019-12-30 NOTE — ED Notes (Signed)
Pt laying in bed resting. Visitor/support person at bedside. Bed locked low. Rail up. Call bell within reach. Introduced self to [employer].

## 2019-12-30 NOTE — ED Notes (Signed)
TTS computer brought into pt's room as requested by consulting staff.

## 2019-12-30 NOTE — ED Notes (Signed)
Notified Kim in [place] that urine preg NEG.

## 2019-12-30 NOTE — ED Triage Notes (Signed)
Pt here via ems from home with c/o ftt since October when she experienced the sudden loss of her son. Pt states she has been under significant stress and isn't eating or sleeping normally, hasn't been taking her depression meds. Tearful during assessment. Denies physical pain at this time.

## 2020-04-03 ENCOUNTER — Other Ambulatory Visit: Payer: Self-pay

## 2020-04-03 ENCOUNTER — Emergency Department
Admission: EM | Admit: 2020-04-03 | Discharge: 2020-04-03 | Disposition: A | Discharge status: Home / Self Care | Payer: Medicare Other | Attending: Emergency Medicine | Admitting: Emergency Medicine

## 2020-04-03 DIAGNOSIS — Z96659 Presence of unspecified artificial knee joint: Secondary | ICD-10-CM | POA: Diagnosis not present

## 2020-04-03 DIAGNOSIS — F329 Major depressive disorder, single episode, unspecified: Secondary | ICD-10-CM | POA: Diagnosis not present

## 2020-04-03 DIAGNOSIS — F1010 Alcohol abuse, uncomplicated: Secondary | ICD-10-CM | POA: Diagnosis not present

## 2020-04-03 DIAGNOSIS — I10 Essential (primary) hypertension: Secondary | ICD-10-CM | POA: Diagnosis not present

## 2020-04-03 DIAGNOSIS — F17200 Nicotine dependence, unspecified, uncomplicated: Secondary | ICD-10-CM | POA: Insufficient documentation

## 2020-04-03 DIAGNOSIS — R220 Localized swelling, mass and lump, head: Secondary | ICD-10-CM | POA: Insufficient documentation

## 2020-04-03 DIAGNOSIS — R21 Rash and other nonspecific skin eruption: Secondary | ICD-10-CM | POA: Diagnosis present

## 2020-04-03 LAB — COMPREHENSIVE METABOLIC PANEL
ALT: 15 U/L (ref 0–44)
AST: 25 U/L (ref 15–41)
Albumin: 3.6 g/dL (ref 3.5–5.0)
Alkaline Phosphatase: 53 U/L (ref 38–126)
Anion gap: 9 (ref 5–15)
BUN: 13 mg/dL (ref 6–20)
CO2: 27 mmol/L (ref 22–32)
Calcium: 8.2 mg/dL — ABNORMAL LOW (ref 8.9–10.3)
Chloride: 104 mmol/L (ref 98–111)
Creatinine, Ser: 0.75 mg/dL (ref 0.44–1.00)
GFR calc Af Amer: >60 mL/min (ref >60)
GFR calc non Af Amer: >60 mL/min (ref >60)
Glucose, Bld: 91 mg/dL (ref 70–99)
Potassium: 4.3 mmol/L (ref 3.5–5.1)
Sodium: 140 mmol/L (ref 135–145)
Total Bilirubin: 0.7 mg/dL (ref 0.3–1.2)
Total Protein: 7.4 g/dL (ref 6.5–8.1)

## 2020-04-03 LAB — CBC WITH DIFFERENTIAL/PLATELET
Abs Immature Granulocytes: 0.04 10*3/uL (ref 0.00–0.07)
Basophils Absolute: 0 10*3/uL (ref 0.0–0.1)
Basophils Relative: 0 %
Eosinophils Absolute: 0.1 10*3/uL (ref 0.0–0.5)
Eosinophils Relative: 2 %
HCT: 32.3 % — ABNORMAL LOW (ref 36.0–46.0)
Hemoglobin: 10.7 g/dL — ABNORMAL LOW (ref 12.0–15.0)
Immature Granulocytes: 1 %
Lymphocytes Relative: 16 %
Lymphs Abs: 1.2 10*3/uL (ref 0.7–4.0)
MCH: 27.4 pg (ref 26.0–34.0)
MCHC: 33.1 g/dL (ref 30.0–36.0)
MCV: 82.6 fL (ref 80.0–100.0)
Monocytes Absolute: 0.4 10*3/uL (ref 0.1–1.0)
Monocytes Relative: 5 %
Neutro Abs: 5.9 10*3/uL (ref 1.7–7.7)
Neutrophils Relative %: 76 %
Platelets: 314 10*3/uL (ref 150–400)
RBC: 3.91 MIL/uL (ref 3.87–5.11)
RDW: 16.1 % — ABNORMAL HIGH (ref 11.5–15.5)
WBC: 7.6 10*3/uL (ref 4.0–10.5)
nRBC: 0 % (ref 0.0–0.2)

## 2020-04-03 MED ORDER — FLUOXETINE HCL 10 MG PO CAPS: 10.0000 mg | ORAL_CAPSULE | Freq: Every day | ORAL | 2 refills | Status: DC

## 2020-04-03 MED ORDER — DIPHENHYDRAMINE HCL 50 MG/ML IJ SOLN
25.0000 mg | Freq: Once | INTRAMUSCULAR | Status: AC
  Administered 2020-04-03: 25 mg via INTRAVENOUS
  Filled 2020-04-03: qty 1

## 2020-04-03 MED ORDER — METHYLPREDNISOLONE SODIUM SUCC 125 MG IJ SOLR
125.0000 mg | Freq: Once | INTRAMUSCULAR | Status: AC
  Administered 2020-04-03: 125 mg via INTRAVENOUS
  Filled 2020-04-03: qty 2

## 2020-04-03 MED ORDER — SODIUM CHLORIDE 0.9 % IV BOLUS
1000.0000 mL | Freq: Once | INTRAVENOUS | Status: AC
  Administered 2020-04-03: 1000 mL via INTRAVENOUS

## 2020-04-03 MED ORDER — FAMOTIDINE IN NACL 20-0.9 MG/50ML-% IV SOLN
20.0000 mg | Freq: Once | INTRAVENOUS | Status: AC
  Administered 2020-04-03: 20 mg via INTRAVENOUS
  Filled 2020-04-03: qty 50

## 2020-04-03 MED ORDER — PREDNISONE 10 MG (21) PO TBPK: ORAL_TABLET | ORAL | 0 refills | Status: DC

## 2020-04-03 NOTE — Discharge Instructions (Addendum)
Follow-up with your regular doctor for recheck in 1 week. Return emergency department worsening. Take the medications as prescribed. You may want to take the Prozac at night as originally it may make you nauseated. Cut back on your alcohol use. Talk to your counselor about your drinking and depression. Return to the emergency department if you are worsening.

## 2020-04-03 NOTE — ED Provider Notes (Signed)
[facility] Emergency Department Provider Note  ____________________________________________   First MD Initiated Contact with Patient 04/03/20 1427     (approximate)  I have reviewed the triage vital signs and the nursing notes.   HISTORY  Chief Complaint Rash and Facial Swelling    HPI Erika Knapp is a 54 y.o. female presents emergency department complaining of rash and swelling to the left side of his face. States it comes and goes. States she notices it more on the days where she is not drinking. States since 10-18-24when her son died she has been drinking to bottles of liquor a day. States she did discuss this with her primary care doctor and they started her on Jordan and Zoloft which she doesn't take. She also has talked with a Veterinary surgeon. She denies SI/HI at this time.    Past Medical History:  Diagnosis Date  . High cholesterol   . Hypertension   . Pulmonary embolism [facility])     Patient Active Problem List   Diagnosis Date Noted  . Seizures (HCC) 04/10/2015    Past Surgical History:  Procedure Laterality Date  . GASTRIC BYPASS    . TOTAL KNEE ARTHROPLASTY      Prior to Admission medications   Medication Sig Start Date End Date Taking? Authorizing Provider  cephALEXin (KEFLEX) 500 MG capsule Take 1 capsule (500 mg total) by mouth 2 (two) times daily. 09/23/19   Jene Every, MD  FLUoxetine (PROZAC) 10 MG capsule Take 1 capsule (10 mg total) by mouth daily. 04/03/20 04/03/21  Milanie Rosenfield, Roselyn Bering, PA-C  LATUDA 120 MG TABS Take 1 tablet by mouth at bedtime. 03/27/15   [provider]  lovastatin (MEVACOR) 20 MG tablet Take 1 tablet by mouth daily. 03/27/15   [provider]  nitrofurantoin (MACRODANTIN) 100 MG capsule Take 1 capsule (100 mg total) by mouth 2 (two) times daily. 08/12/19   Sharman Cheek, MD  predniSONE (STERAPRED UNI-PAK 21 TAB) 10 MG (21) TBPK tablet Take 6 pills on day one then decrease by 1 pill each day  04/03/20   Faythe Ghee, PA-C  sertraline (ZOLOFT) 50 MG tablet Take 50 mg by mouth daily.    [provider]  triamterene-hydrochlorothiazide (MAXZIDE-25) 37.5-25 MG per tablet Take 1 tablet by mouth daily. 03/27/15   [provider]    Allergies Tramadol  Family History  Problem Relation Age of Onset  . High blood pressure Brother   . High blood pressure Sister   . Depression Brother   . Depression Sister   . Bipolar disorder Brother     Social History Social History   Tobacco Use  . Smoking status: Current Every Day Smoker  . Smokeless tobacco: Never Used  Substance Use Topics  . Alcohol use: Yes    Alcohol/week: 0.0 standard drinks    Comment: beer/day  . Drug use: Yes    Types: Marijuana    Review of Systems  Constitutional: No fever/chills Eyes: No visual changes. ENT: No sore throat. Respiratory: Denies cough Cardiovascular: Denies chest pain Gastrointestinal: Denies abdominal pain Genitourinary: Negative for dysuria. Musculoskeletal: Negative for back pain. Skin: Positive for rash. Psychiatric: no mood changes, history of depression    ____________________________________________   PHYSICAL EXAM:  VITAL SIGNS: ED Triage Vitals  Enc Vitals Group     BP 04/03/20 1318 (!) 163/96     Pulse Rate 04/03/20 1318 92     Resp 04/03/20 1318 17  Temp 04/03/20 1321 99.1 F (37.3 C)     Temp Source 04/03/20 1318 Oral     SpO2 04/03/20 1318 97 %     Weight 04/03/20 1319 258 lb (117 kg)     Height 04/03/20 1319 5\' 6"  (1.676 m)     Head Circumference --      Peak Flow --      Pain Score 04/03/20 1319 0     Pain Loc --      Pain Edu? --      Excl. in GC? --     Constitutional: Alert and oriented. Well appearing and in no acute distress. Eyes: Conjunctivae are normal. Some swelling under the orbital area, no jaundice noted Head: Atraumatic. Nose: No congestion/rhinnorhea. Mouth/Throat: Mucous membranes are moist.   Neck:  supple no  lymphadenopathy noted Cardiovascular: Normal rate, regular rhythm. Heart sounds are normal Respiratory: Normal respiratory effort.  No retractions, lungs c t a  Abd: soft nontender bs normal all 4 quad Rectal: Rectal exam is normal, guaiac test is negative GU: deferred Musculoskeletal: FROM all extremities, warm and well perfused Neurologic:  Normal speech and language.  Skin:  Skin is warm, dry and intact. Some swelling on the orbital area, no redness noted, no drainage from the area. Psychiatric: Mood and affect are normal. Speech and behavior are normal.  ____________________________________________   LABS (all labs ordered are listed, but only abnormal results are displayed)  Labs Reviewed  COMPREHENSIVE METABOLIC PANEL - Abnormal; Notable for the following components:      Result Value   Calcium 8.2 (*)    All other components within normal limits  CBC WITH DIFFERENTIAL/PLATELET - Abnormal; Notable for the following components:   Hemoglobin 10.7 (*)    HCT 32.3 (*)    RDW 16.1 (*)    All other components within normal limits   ____________________________________________   ____________________________________________  RADIOLOGY    ____________________________________________   PROCEDURES  Procedure(s) performed: No  Procedures    ____________________________________________   INITIAL IMPRESSION / ASSESSMENT AND PLAN / ED COURSE  Pertinent labs & imaging results that were available during my care of the patient were reviewed by me and considered in my medical decision making (see chart for details).   The patient is a 54 year old female with history of depression, EtOH abuse, and rash. See HPI  Physical exam shows patient to appear well. Some swelling noted under the left orbit, area is not red as infection but more of an allergy type reaction. Guaiac test due to decreased hemoglobin is negative.  DDx: Allergic reaction, hepatitis, GI bleed, anemia,  depression  CBC has decreased H&H of 10.7/32.3, has trended down from 3 months ago, comprehensive metabolic panel is normal Guaiac test is negative  I did discuss these findings and had long discussion with patient about her EtOH abuse. Explained to her that this will only make her depression worse. Encouraged her to continue seeing a counselor due to her son's death. We'll give her a starter dose of Prozac. Patient is not taking her Latuda or Zoloft as she states she didn't feel like they helped. Prozac seems to have less side effects so we'll give this a try. She is to return if she becomes suicidal/homicidal. Follow-up with her doctor next week for recheck. She is also given a Sterapred pack for the itching and swelling. She states she understands. She was discharged in stable condition.  As part of my medical decision making, I reviewed the following data  within the electronic MEDICAL RECORD NUMBER Nursing notes reviewed and incorporated, Labs reviewed , Old chart reviewed, Notes from prior ED visits and [place] Controlled Substance Database  ____________________________________________   FINAL CLINICAL IMPRESSION(S) / ED DIAGNOSES  Final diagnoses:  Facial swelling  Reactive depression (situational)  ETOH abuse      NEW MEDICATIONS STARTED DURING THIS VISIT:  New Prescriptions   FLUOXETINE (PROZAC) 10 MG CAPSULE    Take 1 capsule (10 mg total) by mouth daily.   PREDNISONE (STERAPRED UNI-PAK 21 TAB) 10 MG (21) TBPK TABLET    Take 6 pills on day one then decrease by 1 pill each day     Note:  This document was prepared using Dragon voice recognition software and may include unintentional dictation errors.    Faythe Ghee, PA-C 04/03/20 1559    Jene Every, MD 04/06/20 1254

## 2020-04-03 NOTE — ED Triage Notes (Signed)
Pt reports rash to left side of face and swelling around her eye intermittently for awhile. No difficulty breathing.

## 2022-08-02 ENCOUNTER — Other Ambulatory Visit: Payer: Self-pay | Admitting: Orthopedic Surgery

## 2022-08-02 ENCOUNTER — Ambulatory Visit
Admission: RE | Admit: 2022-08-02 | Discharge: 2022-08-02 | Disposition: A | Discharge status: Home / Self Care | Payer: Medicare Other | Source: Ambulatory Visit | Attending: Orthopedic Surgery | Admitting: Orthopedic Surgery

## 2022-08-02 ENCOUNTER — Other Ambulatory Visit: Payer: Self-pay

## 2022-08-02 ENCOUNTER — Encounter
Admission: RE | Admit: 2022-08-02 | Discharge: 2022-08-02 | Disposition: A | Discharge status: Home / Self Care | Payer: Medicare Other | Source: Ambulatory Visit | Attending: Orthopedic Surgery | Admitting: Orthopedic Surgery

## 2022-08-02 VITALS — Ht 66.0 in | Wt 285.0 lb

## 2022-08-02 DIAGNOSIS — S72401A Unspecified fracture of lower end of right femur, initial encounter for closed fracture: Secondary | ICD-10-CM | POA: Insufficient documentation

## 2022-08-02 DIAGNOSIS — Z01818 Encounter for other preprocedural examination: Secondary | ICD-10-CM

## 2022-08-02 HISTORY — DX: Anxiety disorder, unspecified: F41.9

## 2022-08-02 HISTORY — DX: Depression, unspecified: F32.A

## 2022-08-02 HISTORY — DX: Sleep apnea, unspecified: G47.30

## 2022-08-02 HISTORY — DX: Non-ST elevation (NSTEMI) myocardial infarction: I21.4

## 2022-08-02 NOTE — Patient Instructions (Signed)
Your procedure is scheduled on: 08/04/22 Report to [place]. To find out your arrival time please call [phone] between 1PM - 3PM on 08/03/22.  Remember: Instructions that are not followed completely may result in serious medical risk, up to and including death, or upon the discretion of your surgeon and anesthesiologist your surgery may need to be rescheduled.     _X__ 1. Do not eat food after midnight the night before your procedure.                 No gum chewing or hard candies. You may drink clear liquids up to 2 hours                 before you are scheduled to arrive for your surgery- DO not drink clear                 liquids within 2 hours of the start of your surgery.                 Clear Liquids include:  water, apple juice without pulp, clear carbohydrate                 drink such as Clearfast or Gatorade, Black Coffee or Tea (Do not add                 anything to coffee or tea). Diabetics water only  __X__2.  On the morning of surgery brush your teeth with toothpaste and water, you                 may rinse your mouth with mouthwash if you wish.  Do not swallow any              toothpaste of mouthwash.     _X__ 3.  No Alcohol for 24 hours before or after surgery.   _X__ 4.  Do Not Smoke or use e-cigarettes For 24 Hours Prior to Your Surgery.                 Do not use any chewable tobacco products for at least 6 hours prior to                 surgery.  ____  5.  Bring all medications with you on the day of surgery if instructed.   __X__  6.  Notify your doctor if there is any change in your medical condition      (cold, fever, infections).     Do not wear jewelry, make-up, hairpins, clips or nail polish. Do not wear lotions, powders, or perfumes.  Do not shave body hair 48 hours prior to surgery. Men may shave face and neck. Do not bring valuables to the hospital.    [facility] is not responsible for any  belongings or valuables.  Contacts, dentures/partials or body piercings may not be worn into surgery. Bring a case for your contacts, glasses or hearing aids, a denture cup will be supplied. Leave your suitcase in the car. After surgery it may be brought to your room. For patients admitted to the hospital, discharge time is determined by your treatment team.   Patients discharged the day of surgery will not be allowed to drive home.    __X__ Take these medicines the morning of surgery with A SIP OF WATER:    1. none  2.   3.   4.  5.  6.  ____ Fleet Enema (as directed)   ____ Use CHG Soap/SAGE wipes as directed  ____ Use inhalers on the day of surgery  ____ Stop metformin/Janumet/Farxiga 2 days prior to surgery    ____ Take 1/2 of usual insulin dose the night before surgery. No insulin the morning          of surgery.   ____ Stop Blood Thinners Coumadin/Plavix/Xarelto/Pleta/Pradaxa/Eliquis/Effient/Aspirin  on   Or contact your Surgeon, Cardiologist or Medical Doctor regarding  ability to stop your blood thinners  __X__ Stop Anti-inflammatories 7 days before surgery such as Advil, Ibuprofen, Motrin,  BC or Goodies Powder, Naprosyn, Naproxen, Aleve, Aspirin    __X__ Stop all herbals and supplements, fish oil or vitamins  until after surgery.    ____ Bring C-Pap to the hospital.

## 2022-08-03 MED ORDER — ORAL CARE MOUTH RINSE: 15.0000 mL | Freq: Once | OROMUCOSAL | Status: AC

## 2022-08-03 MED ORDER — LACTATED RINGERS IV SOLN: INTRAVENOUS | Status: DC

## 2022-08-03 MED ORDER — CHLORHEXIDINE GLUCONATE 0.12 % MT SOLN
15.0000 mL | Freq: Once | OROMUCOSAL | Status: AC
  Administered 2022-08-04: 15 mL via OROMUCOSAL

## 2022-08-03 MED ORDER — CEFAZOLIN SODIUM-DEXTROSE 2-4 GM/100ML-% IV SOLN
2.0000 g | INTRAVENOUS | Status: AC
  Administered 2022-08-04: 2 g via INTRAVENOUS

## 2022-08-03 MED ORDER — FAMOTIDINE 20 MG PO TABS
20.0000 mg | ORAL_TABLET | Freq: Once | ORAL | Status: AC
  Administered 2022-08-04: 20 mg via ORAL

## 2022-08-04 ENCOUNTER — Encounter: Payer: Self-pay | Admitting: Orthopedic Surgery

## 2022-08-04 ENCOUNTER — Other Ambulatory Visit: Payer: Self-pay

## 2022-08-04 ENCOUNTER — Inpatient Hospital Stay: Payer: Medicare Other

## 2022-08-04 ENCOUNTER — Inpatient Hospital Stay: Payer: Medicare Other | Admitting: Anesthesiology

## 2022-08-04 ENCOUNTER — Inpatient Hospital Stay
Admission: RE | Admit: 2022-08-04 | Discharge: 2022-08-09 | DRG: 481 | Disposition: A | Discharge status: Home Health | Payer: Medicare Other | Attending: Orthopedic Surgery | Admitting: Orthopedic Surgery

## 2022-08-04 ENCOUNTER — Encounter
Admission: RE | Disposition: A | Discharge status: Home Health | Payer: Self-pay | Source: Home / Self Care | Attending: Orthopedic Surgery

## 2022-08-04 DIAGNOSIS — Z7982 Long term (current) use of aspirin: Secondary | ICD-10-CM

## 2022-08-04 DIAGNOSIS — Z79899 Other long term (current) drug therapy: Secondary | ICD-10-CM

## 2022-08-04 DIAGNOSIS — F32A Depression, unspecified: Secondary | ICD-10-CM | POA: Diagnosis present

## 2022-08-04 DIAGNOSIS — E559 Vitamin D deficiency, unspecified: Secondary | ICD-10-CM | POA: Diagnosis present

## 2022-08-04 DIAGNOSIS — W1830XA Fall on same level, unspecified, initial encounter: Secondary | ICD-10-CM | POA: Diagnosis present

## 2022-08-04 DIAGNOSIS — Z823 Family history of stroke: Secondary | ICD-10-CM | POA: Diagnosis not present

## 2022-08-04 DIAGNOSIS — Z86711 Personal history of pulmonary embolism: Secondary | ICD-10-CM

## 2022-08-04 DIAGNOSIS — Z87891 Personal history of nicotine dependence: Secondary | ICD-10-CM

## 2022-08-04 DIAGNOSIS — Z96652 Presence of left artificial knee joint: Secondary | ICD-10-CM | POA: Diagnosis present

## 2022-08-04 DIAGNOSIS — E7800 Pure hypercholesterolemia, unspecified: Secondary | ICD-10-CM | POA: Diagnosis present

## 2022-08-04 DIAGNOSIS — Z6841 Body Mass Index (BMI) 40.0 and over, adult: Secondary | ICD-10-CM | POA: Diagnosis not present

## 2022-08-04 DIAGNOSIS — M9711XA Periprosthetic fracture around internal prosthetic right knee joint, initial encounter: Secondary | ICD-10-CM | POA: Diagnosis present

## 2022-08-04 DIAGNOSIS — S72491A Other fracture of lower end of right femur, initial encounter for closed fracture: Secondary | ICD-10-CM | POA: Diagnosis present

## 2022-08-04 DIAGNOSIS — Z818 Family history of other mental and behavioral disorders: Secondary | ICD-10-CM

## 2022-08-04 DIAGNOSIS — R569 Unspecified convulsions: Principal | ICD-10-CM

## 2022-08-04 DIAGNOSIS — Z833 Family history of diabetes mellitus: Secondary | ICD-10-CM

## 2022-08-04 DIAGNOSIS — I10 Essential (primary) hypertension: Secondary | ICD-10-CM | POA: Diagnosis present

## 2022-08-04 DIAGNOSIS — Z8249 Family history of ischemic heart disease and other diseases of the circulatory system: Secondary | ICD-10-CM

## 2022-08-04 DIAGNOSIS — I252 Old myocardial infarction: Secondary | ICD-10-CM | POA: Diagnosis not present

## 2022-08-04 DIAGNOSIS — M25561 Pain in right knee: Secondary | ICD-10-CM | POA: Diagnosis present

## 2022-08-04 DIAGNOSIS — S72401A Unspecified fracture of lower end of right femur, initial encounter for closed fracture: Secondary | ICD-10-CM | POA: Diagnosis present

## 2022-08-04 DIAGNOSIS — Z885 Allergy status to narcotic agent status: Secondary | ICD-10-CM | POA: Diagnosis not present

## 2022-08-04 DIAGNOSIS — Z01818 Encounter for other preprocedural examination: Secondary | ICD-10-CM

## 2022-08-04 HISTORY — PX: ORIF FEMUR FRACTURE: SHX2119

## 2022-08-04 LAB — CBC WITH DIFFERENTIAL/PLATELET
Abs Immature Granulocytes: 0.03 10*3/uL (ref 0.00–0.07)
Basophils Absolute: 0 10*3/uL (ref 0.0–0.1)
Basophils Relative: 0 %
Eosinophils Absolute: 0.3 10*3/uL (ref 0.0–0.5)
Eosinophils Relative: 4 %
HCT: 39.7 % (ref 36.0–46.0)
Hemoglobin: 13 g/dL (ref 12.0–15.0)
Immature Granulocytes: 0 %
Lymphocytes Relative: 13 %
Lymphs Abs: 0.9 10*3/uL (ref 0.7–4.0)
MCH: 30.7 pg (ref 26.0–34.0)
MCHC: 32.7 g/dL (ref 30.0–36.0)
MCV: 93.6 fL (ref 80.0–100.0)
Monocytes Absolute: 0.3 10*3/uL (ref 0.1–1.0)
Monocytes Relative: 5 %
Neutro Abs: 5.2 10*3/uL (ref 1.7–7.7)
Neutrophils Relative %: 78 %
Platelets: 315 10*3/uL (ref 150–400)
RBC: 4.24 MIL/uL (ref 3.87–5.11)
RDW: 12.5 % (ref 11.5–15.5)
WBC: 6.7 10*3/uL (ref 4.0–10.5)
nRBC: 0 % (ref 0.0–0.2)

## 2022-08-04 LAB — URINALYSIS, ROUTINE W REFLEX MICROSCOPIC
Bilirubin Urine: NEGATIVE
Glucose, UA: NEGATIVE mg/dL
Ketones, ur: NEGATIVE mg/dL
Leukocytes,Ua: NEGATIVE
Nitrite: NEGATIVE
Protein, ur: NEGATIVE mg/dL
Specific Gravity, Urine: 1.012 (ref 1.005–1.030)
pH: 7 (ref 5.0–8.0)

## 2022-08-04 LAB — COMPREHENSIVE METABOLIC PANEL
ALT: 12 U/L (ref 0–44)
AST: 21 U/L (ref 15–41)
Albumin: 3.3 g/dL — ABNORMAL LOW (ref 3.5–5.0)
Alkaline Phosphatase: 73 U/L (ref 38–126)
Anion gap: 10 (ref 5–15)
BUN: 7 mg/dL (ref 6–20)
CO2: 28 mmol/L (ref 22–32)
Calcium: 8.9 mg/dL (ref 8.9–10.3)
Chloride: 102 mmol/L (ref 98–111)
Creatinine, Ser: 0.71 mg/dL (ref 0.44–1.00)
GFR, Estimated: >60 mL/min (ref >60)
Glucose, Bld: 113 mg/dL — ABNORMAL HIGH (ref 70–99)
Potassium: 3.7 mmol/L (ref 3.5–5.1)
Sodium: 140 mmol/L (ref 135–145)
Total Bilirubin: 1 mg/dL (ref 0.3–1.2)
Total Protein: 7.5 g/dL (ref 6.5–8.1)

## 2022-08-04 LAB — SURGICAL PCR SCREEN
MRSA, PCR: NEGATIVE
Staphylococcus aureus: NEGATIVE

## 2022-08-04 SURGERY — OPEN REDUCTION INTERNAL FIXATION (ORIF) DISTAL FEMUR FRACTURE
Anesthesia: General | Site: Leg Upper | Laterality: Right

## 2022-08-04 MED ORDER — ONDANSETRON HCL 4 MG/2ML IJ SOLN: 4.0000 mg | Freq: Four times a day (QID) | INTRAMUSCULAR | Status: DC | PRN

## 2022-08-04 MED ORDER — PROMETHAZINE HCL 25 MG/ML IJ SOLN: 6.2500 mg | INTRAMUSCULAR | Status: DC | PRN

## 2022-08-04 MED ORDER — METOCLOPRAMIDE HCL 5 MG PO TABS: 5.0000 mg | ORAL_TABLET | Freq: Three times a day (TID) | ORAL | Status: DC | PRN

## 2022-08-04 MED ORDER — PHENOL 1.4 % MT LIQD: 1.0000 | OROMUCOSAL | Status: DC | PRN

## 2022-08-04 MED ORDER — LIDOCAINE HCL (PF) 1 % IJ SOLN
INTRAMUSCULAR | Status: AC
  Filled 2022-08-04: qty 2

## 2022-08-04 MED ORDER — HYDROCODONE-ACETAMINOPHEN 7.5-325 MG PO TABS
1.0000 | ORAL_TABLET | ORAL | Status: DC | PRN
  Administered 2022-08-05 – 2022-08-09 (×17): 2 via ORAL
  Filled 2022-08-04 (×18): qty 2

## 2022-08-04 MED ORDER — ONDANSETRON HCL 4 MG PO TABS: 4.0000 mg | ORAL_TABLET | Freq: Four times a day (QID) | ORAL | Status: DC | PRN

## 2022-08-04 MED ORDER — ENOXAPARIN SODIUM 40 MG/0.4ML IJ SOSY
40.0000 mg | PREFILLED_SYRINGE | INTRAMUSCULAR | Status: DC
  Administered 2022-08-05 – 2022-08-09 (×5): 40 mg via SUBCUTANEOUS
  Filled 2022-08-04 (×5): qty 0.4

## 2022-08-04 MED ORDER — PANTOPRAZOLE SODIUM 40 MG PO TBEC
40.0000 mg | DELAYED_RELEASE_TABLET | Freq: Every day | ORAL | Status: DC
  Administered 2022-08-04 – 2022-08-09 (×6): 40 mg via ORAL
  Filled 2022-08-04 (×6): qty 1

## 2022-08-04 MED ORDER — SODIUM CHLORIDE 0.9 % IV SOLN: INTRAVENOUS | Status: DC

## 2022-08-04 MED ORDER — PHENYLEPHRINE HCL-NACL 20-0.9 MG/250ML-% IV SOLN
INTRAVENOUS | Status: DC | PRN
  Administered 2022-08-04: 25 ug/min via INTRAVENOUS

## 2022-08-04 MED ORDER — BUPIVACAINE HCL (PF) 0.5 % IJ SOLN
INTRAMUSCULAR | Status: DC | PRN
  Administered 2022-08-04: 20 mL via PERINEURAL

## 2022-08-04 MED ORDER — SODIUM CHLORIDE 0.9% IV SOLUTION: Freq: Once | INTRAVENOUS | Status: DC

## 2022-08-04 MED ORDER — SUGAMMADEX SODIUM 500 MG/5ML IV SOLN
INTRAVENOUS | Status: DC | PRN
  Administered 2022-08-04: 517.2 mg via INTRAVENOUS

## 2022-08-04 MED ORDER — KETOROLAC TROMETHAMINE 15 MG/ML IJ SOLN
7.5000 mg | Freq: Four times a day (QID) | INTRAMUSCULAR | Status: AC
  Administered 2022-08-04 – 2022-08-05 (×4): 7.5 mg via INTRAVENOUS
  Filled 2022-08-04 (×4): qty 1

## 2022-08-04 MED ORDER — PROPOFOL 10 MG/ML IV BOLUS
INTRAVENOUS | Status: AC
  Filled 2022-08-04: qty 40

## 2022-08-04 MED ORDER — OXYCODONE HCL 5 MG/5ML PO SOLN: 5.0000 mg | Freq: Once | ORAL | Status: AC | PRN

## 2022-08-04 MED ORDER — ACETAMINOPHEN 10 MG/ML IV SOLN: 1000.0000 mg | Freq: Once | INTRAVENOUS | Status: DC | PRN

## 2022-08-04 MED ORDER — TRANEXAMIC ACID-NACL 1000-0.7 MG/100ML-% IV SOLN
1000.0000 mg | INTRAVENOUS | Status: AC
  Administered 2022-08-04: 1000 mg via INTRAVENOUS

## 2022-08-04 MED ORDER — ROCURONIUM BROMIDE 100 MG/10ML IV SOLN
INTRAVENOUS | Status: DC | PRN
  Administered 2022-08-04: 50 mg via INTRAVENOUS
  Administered 2022-08-04: 40 mg via INTRAVENOUS
  Administered 2022-08-04: 60 mg via INTRAVENOUS

## 2022-08-04 MED ORDER — 0.9 % SODIUM CHLORIDE (POUR BTL) OPTIME
TOPICAL | Status: DC | PRN
  Administered 2022-08-04: 1000 mL

## 2022-08-04 MED ORDER — FENTANYL CITRATE (PF) 100 MCG/2ML IJ SOLN
INTRAMUSCULAR | Status: DC | PRN
  Administered 2022-08-04 (×4): 50 ug via INTRAVENOUS
  Administered 2022-08-04: 100 ug via INTRAVENOUS

## 2022-08-04 MED ORDER — FENTANYL CITRATE (PF) 100 MCG/2ML IJ SOLN
INTRAMUSCULAR | Status: AC
  Filled 2022-08-04: qty 2

## 2022-08-04 MED ORDER — ACETAMINOPHEN 10 MG/ML IV SOLN
INTRAVENOUS | Status: DC | PRN
  Administered 2022-08-04: 1000 mg via INTRAVENOUS

## 2022-08-04 MED ORDER — FAMOTIDINE 20 MG PO TABS
ORAL_TABLET | ORAL | Status: AC
  Filled 2022-08-04: qty 1

## 2022-08-04 MED ORDER — OXYCODONE HCL 5 MG PO TABS: 5.0000 mg | ORAL_TABLET | Freq: Once | ORAL | Status: AC | PRN

## 2022-08-04 MED ORDER — MIDAZOLAM HCL 2 MG/2ML IJ SOLN
INTRAMUSCULAR | Status: AC
  Filled 2022-08-04: qty 2

## 2022-08-04 MED ORDER — LIDOCAINE HCL (PF) 1 % IJ SOLN
INTRAMUSCULAR | Status: DC | PRN
  Administered 2022-08-04: 1 mL via SUBCUTANEOUS

## 2022-08-04 MED ORDER — MENTHOL 3 MG MT LOZG: 1.0000 | LOZENGE | OROMUCOSAL | Status: DC | PRN

## 2022-08-04 MED ORDER — FENTANYL CITRATE (PF) 100 MCG/2ML IJ SOLN
INTRAMUSCULAR | Status: AC
  Administered 2022-08-04: 25 ug via INTRAVENOUS
  Filled 2022-08-04: qty 2

## 2022-08-04 MED ORDER — BUPIVACAINE HCL (PF) 0.5 % IJ SOLN
INTRAMUSCULAR | Status: AC
  Filled 2022-08-04: qty 20

## 2022-08-04 MED ORDER — DROPERIDOL 2.5 MG/ML IJ SOLN: 0.6250 mg | Freq: Once | INTRAMUSCULAR | Status: DC | PRN

## 2022-08-04 MED ORDER — OXYCODONE HCL 5 MG PO TABS
ORAL_TABLET | ORAL | Status: AC
  Administered 2022-08-04: 5 mg via ORAL
  Filled 2022-08-04: qty 1

## 2022-08-04 MED ORDER — ACETAMINOPHEN 500 MG PO TABS
1000.0000 mg | ORAL_TABLET | Freq: Three times a day (TID) | ORAL | Status: AC
  Administered 2022-08-04: 1000 mg via ORAL
  Filled 2022-08-04: qty 2

## 2022-08-04 MED ORDER — HYDROCODONE-ACETAMINOPHEN 5-325 MG PO TABS
1.0000 | ORAL_TABLET | ORAL | Status: DC | PRN
  Administered 2022-08-05: 2 via ORAL
  Filled 2022-08-04 (×2): qty 2

## 2022-08-04 MED ORDER — PROPOFOL 10 MG/ML IV BOLUS
INTRAVENOUS | Status: DC | PRN
  Administered 2022-08-04: 150 mg via INTRAVENOUS

## 2022-08-04 MED ORDER — TRANEXAMIC ACID-NACL 1000-0.7 MG/100ML-% IV SOLN
INTRAVENOUS | Status: AC
  Filled 2022-08-04: qty 100

## 2022-08-04 MED ORDER — FENTANYL CITRATE (PF) 100 MCG/2ML IJ SOLN
25.0000 ug | INTRAMUSCULAR | Status: AC | PRN
  Administered 2022-08-04 (×3): 25 ug via INTRAVENOUS
  Administered 2022-08-04: 50 ug via INTRAVENOUS

## 2022-08-04 MED ORDER — MIDAZOLAM HCL 5 MG/5ML IJ SOLN
INTRAMUSCULAR | Status: DC | PRN
  Administered 2022-08-04 (×2): 2 mg via INTRAVENOUS

## 2022-08-04 MED ORDER — BUPIVACAINE HCL (PF) 0.25 % IJ SOLN
INTRAMUSCULAR | Status: DC | PRN
  Administered 2022-08-04: 30 mL

## 2022-08-04 MED ORDER — DOCUSATE SODIUM 100 MG PO CAPS
100.0000 mg | ORAL_CAPSULE | Freq: Two times a day (BID) | ORAL | Status: DC
  Administered 2022-08-04 – 2022-08-09 (×9): 100 mg via ORAL
  Filled 2022-08-04 (×10): qty 1

## 2022-08-04 MED ORDER — BUPIVACAINE HCL (PF) 0.25 % IJ SOLN
INTRAMUSCULAR | Status: AC
  Filled 2022-08-04: qty 30

## 2022-08-04 MED ORDER — MORPHINE SULFATE (PF) 2 MG/ML IV SOLN
0.5000 mg | INTRAVENOUS | Status: DC | PRN
  Administered 2022-08-04: 1 mg via INTRAVENOUS
  Filled 2022-08-04: qty 1

## 2022-08-04 MED ORDER — DEXAMETHASONE SODIUM PHOSPHATE 10 MG/ML IJ SOLN
INTRAMUSCULAR | Status: DC | PRN
  Administered 2022-08-04: 10 mg via INTRAVENOUS

## 2022-08-04 MED ORDER — ONDANSETRON HCL 4 MG/2ML IJ SOLN
INTRAMUSCULAR | Status: DC | PRN
  Administered 2022-08-04: 4 mg via INTRAVENOUS

## 2022-08-04 MED ORDER — METOCLOPRAMIDE HCL 5 MG/ML IJ SOLN: 5.0000 mg | Freq: Three times a day (TID) | INTRAMUSCULAR | Status: DC | PRN

## 2022-08-04 MED ORDER — LIDOCAINE HCL (CARDIAC) PF 100 MG/5ML IV SOSY
PREFILLED_SYRINGE | INTRAVENOUS | Status: DC | PRN
  Administered 2022-08-04: 100 mg via INTRAVENOUS

## 2022-08-04 MED ORDER — PHENYLEPHRINE HCL (PRESSORS) 10 MG/ML IV SOLN
INTRAVENOUS | Status: DC | PRN
  Administered 2022-08-04 (×3): 80 ug via INTRAVENOUS

## 2022-08-04 MED ORDER — ESMOLOL HCL 100 MG/10ML IV SOLN
INTRAVENOUS | Status: DC | PRN
  Administered 2022-08-04: 20 mg via INTRAVENOUS

## 2022-08-04 MED ORDER — PHENYLEPHRINE HCL-NACL 20-0.9 MG/250ML-% IV SOLN
INTRAVENOUS | Status: AC
  Filled 2022-08-04: qty 250

## 2022-08-04 MED ORDER — FENTANYL CITRATE (PF) 100 MCG/2ML IJ SOLN
INTRAMUSCULAR | Status: AC
  Administered 2022-08-04: 50 ug via INTRAVENOUS
  Filled 2022-08-04: qty 2

## 2022-08-04 MED ORDER — ACETAMINOPHEN 10 MG/ML IV SOLN
INTRAVENOUS | Status: AC
  Filled 2022-08-04: qty 100

## 2022-08-04 MED ORDER — CEFAZOLIN SODIUM-DEXTROSE 2-4 GM/100ML-% IV SOLN
INTRAVENOUS | Status: AC
  Filled 2022-08-04: qty 100

## 2022-08-04 MED ORDER — CEFAZOLIN SODIUM-DEXTROSE 2-4 GM/100ML-% IV SOLN
2.0000 g | Freq: Four times a day (QID) | INTRAVENOUS | Status: AC
  Administered 2022-08-04 – 2022-08-05 (×2): 2 g via INTRAVENOUS
  Filled 2022-08-04 (×2): qty 100

## 2022-08-04 MED ORDER — CHLORHEXIDINE GLUCONATE 0.12 % MT SOLN
OROMUCOSAL | Status: AC
  Filled 2022-08-04: qty 15

## 2022-08-04 SURGICAL SUPPLY — 72 items
APL PRP STRL LF DISP 70% ISPRP (MISCELLANEOUS) ×3
BIT DRILL CALIBRATED 3.2MM (DRILL) IMPLANT
BIT DRILL GUIDEWIRE 2.5X200 (WIRE) IMPLANT
BIT DRILL PERCUTANEOUS 4.3 (BIT) ×1
BIT DRILL PERCUTANEOUS 4.3MM (BIT) IMPLANT
BNDG CMPR 5X6 CHSV STRCH STRL (GAUZE/BANDAGES/DRESSINGS)
BNDG COHESIVE 6X5 TAN ST LF (GAUZE/BANDAGES/DRESSINGS) ×1 IMPLANT
BNDG ELASTIC 6X5.8 VLCR STR LF (GAUZE/BANDAGES/DRESSINGS) IMPLANT
CHLORAPREP W/TINT 26 (MISCELLANEOUS) ×1 IMPLANT
COVER BACK TABLE REUSABLE LG (DRAPES) ×1 IMPLANT
DRAPE C-ARM XRAY 36X54 (DRAPES) ×1 IMPLANT
DRAPE C-ARMOR (DRAPES) ×1 IMPLANT
DRAPE HD 5FT BACK TABLE (DRAPES) IMPLANT
DRAPE INCISE IOBAN 66X60 STRL (DRAPES) ×2 IMPLANT
DRAPE ORTHO SPLIT 77X108 STRL (DRAPES) ×2
DRAPE SURG ORHT 6 SPLT 77X108 (DRAPES) ×2 IMPLANT
DRAPE U-SHAPE 47X51 STRL (DRAPES) ×2 IMPLANT
DRILL BIT PERCUTANEOUS 4.3MM (BIT) ×1
DRILL CALIBRATED 3.2MM (DRILL) ×1
DRSG OPSITE POSTOP 4X6 (GAUZE/BANDAGES/DRESSINGS) ×2 IMPLANT
DRSG OPSITE POSTOP 4X8 (GAUZE/BANDAGES/DRESSINGS) IMPLANT
ELECT CAUTERY BLADE 6.4 (BLADE) IMPLANT
ELECT REM PT RETURN 9FT ADLT (ELECTROSURGICAL) ×1
ELECTRODE REM PT RTRN 9FT ADLT (ELECTROSURGICAL) ×1 IMPLANT
GAUZE 4X4 16PLY ~~LOC~~+RFID DBL (SPONGE) ×1 IMPLANT
GAUZE SPONGE 4X4 12PLY STRL (GAUZE/BANDAGES/DRESSINGS) ×1 IMPLANT
GAUZE XEROFORM 1X8 LF (GAUZE/BANDAGES/DRESSINGS) ×1 IMPLANT
GLOVE BIO SURGEON STRL SZ8 (GLOVE) ×4 IMPLANT
GLOVE BIOGEL PI IND STRL 8 (GLOVE) ×1 IMPLANT
GLOVE PI ORTHO PRO STRL 7.5 (GLOVE) ×1 IMPLANT
GLOVE PI ORTHO PRO STRL SZ8 (GLOVE) ×1 IMPLANT
GLOVE SURG SYN 7.5  E (GLOVE) ×1
GLOVE SURG SYN 7.5 E (GLOVE) ×1 IMPLANT
GLOVE SURG SYN 7.5 PF PI (GLOVE) ×1 IMPLANT
GOWN STRL REUS W/ TWL LRG LVL3 (GOWN DISPOSABLE) ×1 IMPLANT
GOWN STRL REUS W/ TWL XL LVL3 (GOWN DISPOSABLE) ×1 IMPLANT
GOWN STRL REUS W/TWL LRG LVL3 (GOWN DISPOSABLE) ×3
GOWN STRL REUS W/TWL XL LVL3 (GOWN DISPOSABLE) ×1
HOLSTER ELECTROSUGICAL PENCIL (MISCELLANEOUS) ×1 IMPLANT
HOOD PEEL AWAY T7 (MISCELLANEOUS) IMPLANT
KIT TURNOVER KIT A (KITS) ×1 IMPLANT
MANIFOLD NEPTUNE II (INSTRUMENTS) ×2 IMPLANT
NDL HYPO 21X1.5 SAFETY (NEEDLE) IMPLANT
NEEDLE HYPO 21X1.5 SAFETY (NEEDLE) ×1 IMPLANT
NS IRRIG 1000ML POUR BTL (IV SOLUTION) ×1 IMPLANT
PACK HIP PROSTHESIS (MISCELLANEOUS) ×1 IMPLANT
PENCIL SMOKE EVACUATOR (MISCELLANEOUS) IMPLANT
PLATE CONDYLAR 12 HOLE (Plate) IMPLANT
SCREW CORTEX ST 4.5X42 (Screw) IMPLANT
SCREW CORTEX ST 4.5X52 (Screw) IMPLANT
SCREW LOCK 5.0X18 (Screw) IMPLANT
SCREW LOCK 5.0X44 (Screw) IMPLANT
SCREW LOCK 5.0X80 (Screw) IMPLANT
SCREW LOCK 5X20 (Screw) IMPLANT
SCREW LOCKING VA 5.0X46MM (Screw) IMPLANT
SCREW LOCKING VA 5.0X75MM (Screw) IMPLANT
SLEEVE SCD COMPRESS KNEE MED (STOCKING) IMPLANT
SOLUTION IRRIG SURGIPHOR (IV SOLUTION) IMPLANT
SPONGE T-LAP 18X18 ~~LOC~~+RFID (SPONGE) ×5 IMPLANT
STAPLER SKIN PROX 35W (STAPLE) ×2 IMPLANT
STOCKINETTE M/LG 89821 (MISCELLANEOUS) ×1 IMPLANT
SUT BONE WAX W31G (SUTURE) IMPLANT
SUT MNCRL AB 3-0 PS2 27 (SUTURE) IMPLANT
SUT VIC AB 0 CT1 18XCR BRD 8 (SUTURE) IMPLANT
SUT VIC AB 0 CT1 8-18 (SUTURE) ×1
SUT VIC AB 2-0 CT1 27 (SUTURE) ×2
SUT VIC AB 2-0 CT1 TAPERPNT 27 (SUTURE) ×2 IMPLANT
TRAP FLUID SMOKE EVACUATOR (MISCELLANEOUS) ×1 IMPLANT
TUBE SUCT KAM VAC (TUBING) IMPLANT
WATER STERILE IRR 1000ML POUR (IV SOLUTION) IMPLANT
WATER STERILE IRR 500ML POUR (IV SOLUTION) ×1 IMPLANT
WIRE DRILL TIP 2.5 300MM (WIRE) IMPLANT

## 2022-08-04 NOTE — Anesthesia Procedure Notes (Signed)
Anesthesia Regional Block: Femoral nerve block   Pre-Anesthetic Checklist: , timeout performed,  Correct Patient, Correct Site, Correct Laterality,  Correct Procedure, Correct Position, site marked,  Risks and benefits discussed,  Surgical consent,  Pre-op evaluation,  At surgeon's request and post-op pain management  Laterality: Right  Prep: chloraprep       Needles:  Injection technique: Single-shot  Needle Type: Stimiplex     Needle Length: 9cm  Needle Gauge: 22     Additional Needles:   Procedures:,,,, ultrasound used (permanent image in chart),,    Narrative:  Start time: 08/04/2022 9:21 AM End time: 08/04/2022 9:28 AM Injection made incrementally with aspirations every 5 mL.  Performed by: Personally  Anesthesiologist: Foye Deer, MD  Additional Notes: Patient consented for risk and benefits of nerve block including but not limited to nerve damage, failed block, bleeding and infection.  Patient voiced understanding.  Functioning IV was confirmed and monitors were applied.  Timeout done prior to procedure and prior to any sedation being given to the patient.  Patient confirmed procedure site prior to any sedation given to the patient. Sterile prep,hand hygiene and sterile gloves were used.  Minimal sedation used for procedure.  No paresthesia endorsed by patient during the procedure.  Negative aspiration and negative test dose prior to incremental administration of local anesthetic. The patient tolerated the procedure well with no immediate complications.

## 2022-08-04 NOTE — Interval H&P Note (Signed)
History and Physical Interval Note:  08/04/2022 9:44 AM  Erika Knapp  has presented today for surgery, with the diagnosis of Closed fracture of distal end of right femur, unspecified fracture morphology, initial encounter S72.401A Right knee pain, unspecified chronicity M25.561.  The various methods of treatment have been discussed with the patient and family. After consideration of risks, benefits and other options for treatment, the patient has consented to  Procedure(s): OPEN REDUCTION INTERNAL FIXATION (ORIF) DISTAL FEMUR FRACTURE (Right) as a surgical intervention.  The patient's history has been reviewed, patient examined, no change in status, stable for surgery.  I have reviewed the patient's chart and labs.  Questions were answered to the patient's satisfaction.     Reinaldo Berber

## 2022-08-04 NOTE — Anesthesia Postprocedure Evaluation (Signed)
Anesthesia Post Note  Patient: Erika Knapp  Procedure(s) Performed: OPEN REDUCTION INTERNAL FIXATION (ORIF) DISTAL FEMUR FRACTURE (Right: Leg Upper)  Patient location during evaluation: PACU Anesthesia Type: General Level of consciousness: awake and alert Pain management: pain level controlled Vital Signs Assessment: post-procedure vital signs reviewed and stable Respiratory status: spontaneous breathing, nonlabored ventilation and respiratory function stable Cardiovascular status: blood pressure returned to baseline and stable Postop Assessment: no apparent nausea or vomiting Anesthetic complications: yes   Encounter Notable Events  Notable Event Outcome Phase Comment  Difficult to intubate - unexpected  Intraprocedure Filed from anesthesia note documentation.     Last Vitals:  Vitals:   08/04/22 1510 08/04/22 1520  BP: 137/80 (!) 140/81  Pulse: 73 82  Resp: 16 15  Temp:    SpO2: 96% 96%    Last Pain:  Vitals:   08/04/22 1510  TempSrc:   PainSc: 6                  Keithan Dileonardo Romie Minus

## 2022-08-04 NOTE — Anesthesia Procedure Notes (Signed)
Procedure Name: Intubation Date/Time: 08/04/2022 10:14 AM  Performed by: Nelda Marseille, CRNAPre-anesthesia Checklist: Patient identified, Patient being monitored, Timeout performed, Emergency Drugs available and Suction available Patient Re-evaluated:Patient Re-evaluated prior to induction Oxygen Delivery Method: Circle System Utilized Preoxygenation: Pre-oxygenation with 100% oxygen Induction Type: IV induction Ventilation: Mask ventilation without difficulty Laryngoscope Size: Mac, 3 and McGraph Grade View: Grade I Tube type: Oral Tube size: 7.0 mm Number of attempts: 1 Airway Equipment and Method: Stylet and Oral airway Placement Confirmation: ETT inserted through vocal cords under direct vision, positive ETCO2 and breath sounds checked- equal and bilateral Secured at: 21 cm Tube secured with: Tape Dental Injury: Teeth and Oropharynx as per pre-operative assessment  Difficulty Due To: Difficulty was unanticipated

## 2022-08-04 NOTE — OR Nursing (Signed)
Patient with small veins. #22 gauge inserted in the left forearm with second attempt.

## 2022-08-04 NOTE — Plan of Care (Signed)
  Problem: Health Behavior/Discharge Planning: Goal: Ability to manage health-related needs will improve Outcome: Progressing   Problem: Nutrition: Goal: Adequate nutrition will be maintained Outcome: Progressing   Problem: Safety: Goal: Ability to remain free from injury will improve Outcome: Progressing   

## 2022-08-04 NOTE — Anesthesia Preprocedure Evaluation (Addendum)
Anesthesia Evaluation  Patient identified by MRN, date of birth, ID band Patient awake    Reviewed: Allergy & Precautions, NPO status , Patient's Chart, lab work & pertinent test results  Airway Mallampati: III  TM Distance: >3 FB Neck ROM: full    Dental  (+) Missing   Pulmonary sleep apnea , former smoker H/o PE 2001   Pulmonary exam normal        Cardiovascular hypertension, + Past MI  Normal cardiovascular exam  Per cardiology NOTE: NSTEMI (non-ST elevated myocardial infarction) Patient recently evaluated at [facility] ED on 11/24/21 for intractable nausea/vomiting and central chest pain likely secondary to alcoholic gastritis. Initial troponins elevated however did trend downwards. Other labs revealed INR 0.85, PTT 88>22, D-Dimer 2.26, WBC 5.2, HGB 13.5, MCV 88.6, Plt 306 and alcohol negative. Initial EKG revealed sinus tachycardia without ST elevation, HR 111. CXR was unremarkable and chest CTA negative for PE. Symptoms attributed to NSTEMI due to demand ischemia from dehydration. Initially treated with heparin and discharged on ASA. EKG today NSR, LAFB.    03/28/2022 2:50 PM EDT  This result has an attachment that is not available.  Exam: NM MYOCARDIAL PERFUSION SPECT MULTIPLE Study performed at [facility] at [place] ++++++++++++++++++++++++++++++++++++++++++++ Impressions: - No significant ischemia is noted on perfusion imaging - There is a large in size, severe, fixed defect involving the basal inferoseptal, basal inferior, mid inferoseptal, mid inferior, apical inferior and apical segments.  This is consistent with possible artifact (subdiaphragmatic activity, motion artifact and misregistration artifact) or probable scar. - There is a medium in size, severe, fixed defect involving the basal inferolateral, mid inferolateral and apical lateral segments.  This is consistent with possible artifact  (subdiaphragmatic activity, misregistration artifact and motion artifact) and probable scar involving the LCx. - Post stress:  Global systolic function is normal.  The ejection fraction calculated at 55%. - Mild transient LV dilation was noted with stress, while this finding is non-specific, it can be associated with so called diffuse "balanced" ischemia  ++++++++++++++++++++++++++++++++++++++++++++  Exam: Two-Day Rest/Stress Tc-65M Tetrofosmin - Regadenoson Name: Erika Knapp, Erika Knapp (Female) Date of Birth: [birth year] [phone]) Accession #: ID: [id number] Date of Procedure: 03/22/2022 Primary Attending: Baxter Hire and Stress Supervisor: Baxter Hire Referring Physician: Baxter Hire  Clinical Indications: chest pain Cardiac Risk Factors: hyperlipidemia and hypertension Past Cardiac Procedures: none reported Medications: none reported Other Conditions: none reported  Protocol: Two-Day Rest/Stress Tc-65M Tetrofosmin - Regadenoson The patient was brought to the nuclear laboratory and given 25.5 mCi of Tc-58m tetrofosmin intravenously.  Approximately 15 minutes later, the patient had the resting images performed.  The patient was given a 0.4 mg dose of regadenoson intravenously over 15 seconds.  Exercise was not attempted because the patient was unable to exercise.  After the IV administration of 39.9 mCi of Tc-79m tetrofosmin at peak exercise, stress tomographic images of the heart were obtained.   Motion Issues: none reported Attenuation Issues: none reported Other Quality Issues: moderate TID and marked adjacent gut activity noted  Clinical Findings: Ht: 65 in (165 cm)    Wt: 192 lb (87.1 kg)    BSA: 1.94 m2    BMI: 31.99 m2 Exercise: No exercise is performed for this type of protocol. Baseline: HR = 88 BP = 172/100 Peak: HR = 107 BP = 132/80 Baseline EKG: normal sinus rhythm, normal axis and non-specific ST T changes Baseline Arrhythmia: none reported Stress EKG: negative for significant ST  depression Stress Arrhythmia: none reported Stress Symptoms: typical regadenoson symptoms  Nuclear Perfusion Findings: There is a medium in size, severe, fixed defect involving the basal inferolateral, mid inferolateral and apical lateral segments.  This is consistent with possible artifact (subdiaphragmatic activity, misregistration artifact and motion artifact) and probable scar involving the LCx.  There is a large in size, severe, fixed defect involving the basal inferoseptal, basal inferior, mid inferoseptal, mid inferior, apical inferior and apical segments.  This is consistent with possible artifact (subdiaphragmatic activity, motion artifact and misregistration artifact) or probable scar.  Nuclear Wall Motion Findings: Post stress:  Global systolic function is normal.  The ejection fraction calculated at 55%.   Rudean Haskell was available for the stress portion of the test.     Neuro/Psych  PSYCHIATRIC DISORDERS Anxiety Depression Bipolar Disorder   negative neurological ROS     GI/Hepatic Neg liver ROS,GERD  Medicated and Controlled,,S/p GASTRIC BYPASS   Endo/Other  negative endocrine ROS    Renal/GU Renal InsufficiencyRenal disease     Musculoskeletal   Abdominal  (+) + obese  Peds  Hematology negative hematology ROS (+)   Anesthesia Other Findings Mechanical fall on 07/26/2022 twisting and landing on her right knee.  Right distal femur periprosthetic fracture  Past Medical History: No date: Anxiety No date: Depression No date: High cholesterol No date: Hypertension No date: NSTEMI (non-ST elevated myocardial infarction) (HCC) 2001: Pulmonary embolism (HCC)     Comment:  lung No date: Sleep apnea  Past Surgical History: No date: GASTRIC BYPASS No date: TOTAL KNEE ARTHROPLASTY; Bilateral No date: TUBAL LIGATION     Reproductive/Obstetrics negative OB ROS                             Anesthesia Physical Anesthesia  Plan  ASA: 3  Anesthesia Plan: General   Post-op Pain Management: Ofirmev IV (intra-op)* and Regional block*   Induction: Intravenous  PONV Risk Score and Plan: 2 and Ondansetron, Dexamethasone and Midazolam  Airway Management Planned: Oral ETT  Additional Equipment:   Intra-op Plan:   Post-operative Plan: Extubation in OR  Informed Consent: I have reviewed the patients History and Physical, chart, labs and discussed the procedure including the risks, benefits and alternatives for the proposed anesthesia with the patient or authorized representative who has indicated his/her understanding and acceptance.     Dental Advisory Given  Plan Discussed with: Anesthesiologist, CRNA and Surgeon  Anesthesia Plan Comments:         Anesthesia Quick Evaluation

## 2022-08-04 NOTE — Op Note (Signed)
Patient Name: Erika Knapp  BJS:[id number]  Pre-Operative Diagnosis: Right distal femur periprosthetic fracture  Post-Operative Diagnosis: (same)  Procedure: Right distal femur open reduction internal fixation.   Components/Implants: Synthes 12 hole lateral condylar distal femur plate with 6.0VP cortex screws and 85mm VA locking screws.   Date of Surgery: 08/04/2022  Surgeon: Reinaldo Berber MD  Assistant: [facility] RNFA   Anesthesiologist: Laural Benes  Anesthesia: General   EBL: 200  Urine: 200  IVF: 1300  Complications: None   Brief history: The patient is a 56 year old female who presented to my office with a right distal femur periprosthetic fracture which occurred a week prior and was seen in the emergency room and sent out a knee immobilizer .  I reviewed x-ray images and CT scan images of her right femur and knee which revealed a comminuted periprosthetic fracture extending onto the anterior flange of her prior total knee replacement .  The fracture was in extension with unacceptable alignment.  We discussed the risks and benefits of closed reduction and splinting versus surgical intervention with open reduction versus distal femur replacement pending stability of the implant .  Due to body habitus and difficulty with casting and holding a reduction in her distal femur we made the decision to proceed with surgery . All preoperative films were reviewed and an appropriate surgical plan was made prior to surgery.   Description of procedure: The patient was brought to the operating room where laterality was confirmed by all those present to be the right side.   General anesthesia was administered and the patient received an intravenous dose of antibiotics for surgical prophylaxis and a dose of tranexamic acid.  Patient is positioned supine on the operating room table with all bony prominences well-padded.   The leg was then prepped and draped in usual sterile  fashion with multiple layers of adhesive and nonadhesive drapes.  All of those present in the operating room participated in a surgical timeout laterality and patient were confirmed.   Under fluoroscopic guidance the surface anatomy was marked with a marking pen of the lateral femoral condyle and the most possible aspect of the sagittal split of the fracture down the femoral diaphysis.  An incision was made over the lateral aspect of the lateral epicondyle.  All bleeding vessels were coagulated and the IT band was exposed.  An inline incision was made within the IT band exposing the lateral femoral condyle and prior knee replacement.  There was a large evacuation of old appearing hematoma from the knee joint.  A Cobb elevator was used to to make a sub muscular space over the lateral femoral shaft.  A 12 hole Synthes distal lateral condylar femur plate was then slid up the femur into position and pinned in place distally over the femoral condyle with a K wire.  A second K wire was applied distally and indirect reduction was achieved by flexing the construct through the distal femur and reducing the extension deformity.    The targeting jig was then used to apply a K wire proximally and the position of the plate was confirmed on AP and lateral fluoroscopic imaging proximally and distally.  Due to significant comminution anteriorly and the distal nature of the fracture pattern around the total knee. The plate was positioned more posterior to allow for better screw fixation in the well fixed intact bone around the knee implant.  This meant that there was a small amount of distraction from the plate to the bone  as it progressed over the condyle and proximally.  A locking construct was used with locking screws distally and a cortical screw was used proximally to reduce the plate to the bone.  The construct was completed with additional locking screws in the distal holes and a combination of locking and cortical screws  in the proximal holes on the shaft.  Final fluoroscopic imaging AP and lateral proximally distally was performed to assess the position and of the fracture and the plate on the bone.  The knee was taken through range of motion to ensure no screws entered the joint.   The knee and incisions were irrigated with copious amount of normal saline and the surgical incisions and soft tissues were injected with quarter percent Marcaine.  The split in the IT band was closed with #1 Vicryl suture and #2 PDS barbed suture.  The subcutaneous tissues and skin were closed with 0 Vicryl 2-0 Vicryl and a running 3-0 Mono Derm Quill suture and then Dermabond. Sterile dressing and a knee immobilizer were applied.    sponge, needle, and Lap counts were all correct at the end of the case.   The patient was transferred off of the operating room table to a hospital bed, good pulses were found distally on the operative side.  The patient was transferred to the recovery room in stable condition.

## 2022-08-04 NOTE — H&P (Signed)
History of Present Illness: The patient is an 56 y.o. female seen in clinic today for right knee pain.  Patient reports she had a mechanical fall on 07/26/2022 twisting and landing on her right knee .  Patient was unable to ambulate after the fall and was seen in the emergency room and diagnosed with a fracture of her right femur and comes in today for follow-up .  She has had significant pain in her right knee and has been wearing her knee immobilizer since the fall .  The patient denies fevers, chills, numbness, tingling, shortness of breath, chest pain, recent illness, or any other trauma.     Past Medical History:     Past Medical History:  Diagnosis Date   Allergic state     Arthritis     Chickenpox     Chronic renal disease, stage 3, moderately decreased glomerular filtration rate between 30-59 mL/min/1.73 square meter (CMS-HCC) 04/08/2016    Cr 1.2 GFR 57   Depression     Enlarged heart     Essential hypertension 03/09/2016   High cholesterol     Hypertension     Microalbuminuria 03/09/2016   Migraines     Pulmonary embolism (CMS-HCC)      s/p IVC filter, removed in 2015.   Vitamin D insufficiency 03/09/2016      Past Surgical History:      Past Surgical History:  Procedure Laterality Date   RIGHT TOTAL KNEE REPLACEMENT   2001   LEFT TOTAL KNEE REPLACEMENT   04/22/2002      Past Family History: Family History       Family History  Problem Relation Age of Onset   Diabetes Father     Mental illness Father     Allergies Father     Rheum arthritis Father     Allergies Other          Siblings   High blood pressure (Hypertension) Other          Siblings   Mental illness Other          Siblings   Stroke Other          Siblings   Diabetes Other          Siblings       Medications:       Current Outpatient Medications Ordered in Epic  Medication Sig Dispense Refill   acetaminophen (TYLENOL ORAL) Take by mouth       aspirin 81 MG EC tablet Take 81 mg by mouth once  daily. Reported on 01/22/2016  (Patient not taking: Reported on 08/02/2022)       ERGOCALCIFEROL, VITAMIN D2, (VITAMIN D3 ORAL) Take by mouth.       LATUDA 120 mg tablet take 1 tablet by mouth once daily 15 tablet 0   losartan (COZAAR) 25 MG tablet take 1/2 tablet by mouth once daily for MICROALBUMINURIA 15 tablet 0   omeprazole (PRILOSEC) 20 MG DR capsule take 1 capsule by mouth once daily 30 capsule 0   sertraline (ZOLOFT) 50 MG tablet take 1 and 1/2 tablet by mouth at bedtime 45 tablet 0   triamcinolone 0.1 % lotion Apply topically. (Patient not taking: Reported on 08/02/2022)        No current Epic-ordered facility-administered medications on file.      Allergies:      Allergies  Allergen Reactions   Ultram [Tramadol] Hives      Other reaction(s): HIVES  Review of Systems:  A comprehensive 14 point ROS was performed, reviewed, and the pertinent orthopaedic findings are documented in the HPI.   Physical Exam: General/Constitutional: No apparent distress: well-nourished and well developed. Eyes: Pupils equal, round with synchronous movement.   Pulmonary exam: Lungs clear to auscultation bilaterally no wheezing rales or rhonchi Cardiac exam: Regular rate and rhythm no obvious murmurs rubs or gallops.    Integumentary: No impressive skin lesions present, except as noted in detailed exam. Neuro/Psych: Normal mood and affect, oriented to person, place and time.   Comprehensive Knee Exam: Gait Unable to ambulate in wheelchair  Alignment Neutral    Inspection   Right Left  Skin Healed midline skin incision Healed Skin incision  Soft Tissue Swelling around the knee No focal soft tissue swelling  Quad Atrophy None None    Palpation    Right Left  Tenderness Tender to palpation over the distal femur lateral knee No peripatellar, patellar tendon, quad tendon, medial/lateral joint line pain  Crepitus Range of motion deferred No patellofemoral or tibiofemoral crepitus  Effusion  Large None    Range of Motion   Right Left  Flexion  Range of motion deferred Normal AROM  Extension  Range of motion deferred Full knee extension without hyperextension                 Neurovascular   Right Left  Quadriceps Strength Deferred 5/5  Hamstring Strength Deferred 5/5  Hip Abductor Strength 4/5 4/5  Distal Motor Normal Normal  Distal Sensory Normal light touch sensation Normal light touch sensation  Distal Pulses Normal Normal      Imaging Studies: I have reviewed AP, lateral, and oblique none weight bearing X-rays (3 views) of the Right knee ordered and taken today in the office show an impacted fracture of the distal femur at the level of the anterior flange of the prior total knee replacement in neutral alignment on AP extended approximately 10 degrees on lateral with evidence of comminution underneath the anterior flange.  The bone appears to be well fixed to the implant distally and posteriorly.  The tibia implant appears well fixed without any evidence of periprosthetic fracture in the tibia.  Large effusion noted.    Assessment:      ICD-10-CM  1. Right knee pain, unspecified chronicity  M25.561  Periprosthetic right distal femur fracture   Plan: Verlon Au is a 56 year old female who presents with a right distal femur periprosthetic fracture.  X-ray images suggest malalignment of the fracture hyperextension deformity .  A long conversation took place with the patient and her husband about the nature of periprosthetic fractures fracture healing and surgical locations for treatment .  We discussed the current alignment of her fracture and the likelihood of continued malalignment and deformity and disability in the future if this fracture was not addressed .  We discussed open reduction internal fixation and distal femoral replacement as possible treatment options .  I will obtain a CT scan to fully evaluate the bone interface but I am optimistic that it appears there is enough  bone fixation to the knee to allow for open reduction internal fixation .    The hospitalization and post-operative care and rehabilitation were also discussed. The use of perioperative antibiotics and DVT prophylaxis were discussed. The risk, benefits and alternatives to a surgical intervention were discussed at length with the patient. The patient was also advised of risks related to the medical comorbidities and elevated body mass index (  BMI) and the fact that she is still a current less than half a pack a day smoker.  She stated she will stop smoking in order to help heal.  A lengthy discussion took place to review the most common complications including but not limited to: stiffness, loss of function, complex regional pain syndrome, deep vein thrombosis, pulmonary embolus, heart attack, stroke, infection, wound breakdown, numbness, damage to nerves, tendon,muscles, arteries or other blood vessels, hardware breakage, malunion, nonunion, death and other possible complications from anesthesia. The patient was told that we will take steps to minimize these risks by using sterile technique, antibiotics and DVT prophylaxis when appropriate and follow the patient postoperatively in the office setting to monitor progress. The possibility of recurrent pain, no improvement in pain and actual worsening of pain were also discussed with the patient.  The patient has a history of PE after knee replacement and will be placed on aspirin preoperatively and likely Lovenox postoperatively.    The discharge plan of care focused on the patient going home following surgery. The patient was encouraged to make the necessary arrangements to have someone stay with them when they are discharged home.    The benefits of surgery were discussed with the patient including the potential for improving the patient's current clinical condition through operative intervention. Alternatives to surgical intervention including continued  conservative management were also discussed in detail. All questions were answered to the satisfaction of the patient. The patient participated and agreed to the plan of care for open reduction internal fixation of the right distal femur possible distal femur replacement if the component is loose. an information packet was given to the patient to review prior to surgery.    Questions answered the patient agrees to the above plan to proceed with surgery on her right distal femur.    No signs of DVT or PE at time of evaluation on day of surgery. Negative Homans, no shortness of breath, no redness or warmth to the calf.  All history and physical updated at time of surgery. Patient agrees with above plan. Will start with attempted ORIF and will go to Distal femur replacement if components do not appear well fixed to bone.

## 2022-08-04 NOTE — Transfer of Care (Signed)
Immediate Anesthesia Transfer of Care Note  Patient: Erika Knapp  Procedure(s) Performed: OPEN REDUCTION INTERNAL FIXATION (ORIF) DISTAL FEMUR FRACTURE (Right: Leg Upper)  Patient Location: PACU  Anesthesia Type:General  Level of Consciousness: awake, alert , and oriented  Airway & Oxygen Therapy: Patient Spontanous Breathing and Patient connected to nasal cannula oxygen  Post-op Assessment: Report given to RN and Post -op Vital signs reviewed and stable  Post vital signs: Reviewed and stable  Last Vitals:  Vitals Value Taken Time  BP 145/88 08/04/22 1350  Temp 36.1 C 08/04/22 1335  Pulse 71 08/04/22 1352  Resp 19 08/04/22 1352  SpO2 99 % 08/04/22 1352  Vitals shown include unvalidated device data.  Last Pain:  Vitals:   08/04/22 1340  TempSrc:   PainSc: 8       Patients Stated Pain Goal: 3 (08/04/22 1335)  Complications:  Encounter Notable Events  Notable Event Outcome Phase Comment  Difficult to intubate - unexpected  Intraprocedure Filed from anesthesia note documentation.

## 2022-08-05 LAB — BPAM RBC
Blood Product Expiration Date: 202311232359
Blood Product Expiration Date: 202311232359
Unit Type and Rh: 600
Unit Type and Rh: 600

## 2022-08-05 LAB — CBC
HCT: 30.4 % — ABNORMAL LOW (ref 36.0–46.0)
Hemoglobin: 10.1 g/dL — ABNORMAL LOW (ref 12.0–15.0)
MCH: 31.3 pg (ref 26.0–34.0)
MCHC: 33.2 g/dL (ref 30.0–36.0)
MCV: 94.1 fL (ref 80.0–100.0)
Platelets: 271 10*3/uL (ref 150–400)
RBC: 3.23 MIL/uL — ABNORMAL LOW (ref 3.87–5.11)
RDW: 12.3 % (ref 11.5–15.5)
WBC: 6.7 10*3/uL (ref 4.0–10.5)
nRBC: 0 % (ref 0.0–0.2)

## 2022-08-05 LAB — TYPE AND SCREEN
ABO/RH(D): A NEG
Antibody Screen: NEGATIVE
Unit division: 0
Unit division: 0

## 2022-08-05 LAB — BASIC METABOLIC PANEL
Anion gap: 3 — ABNORMAL LOW (ref 5–15)
BUN: 10 mg/dL (ref 6–20)
CO2: 27 mmol/L (ref 22–32)
Calcium: 8.1 mg/dL — ABNORMAL LOW (ref 8.9–10.3)
Chloride: 105 mmol/L (ref 98–111)
Creatinine, Ser: 0.84 mg/dL (ref 0.44–1.00)
GFR, Estimated: >60 mL/min (ref >60)
Glucose, Bld: 115 mg/dL — ABNORMAL HIGH (ref 70–99)
Potassium: 4.3 mmol/L (ref 3.5–5.1)
Sodium: 135 mmol/L (ref 135–145)

## 2022-08-05 LAB — PREPARE RBC (CROSSMATCH)

## 2022-08-05 MED ORDER — DIPHENHYDRAMINE HCL 25 MG PO CAPS
25.0000 mg | ORAL_CAPSULE | Freq: Four times a day (QID) | ORAL | Status: DC | PRN
  Administered 2022-08-05 – 2022-08-09 (×10): 25 mg via ORAL
  Filled 2022-08-05 (×10): qty 1

## 2022-08-05 MED ORDER — MAGNESIUM CITRATE PO SOLN: 30.0000 mL | Freq: Every day | ORAL | Status: DC | PRN

## 2022-08-05 MED ORDER — CELECOXIB 200 MG PO CAPS: 200.0000 mg | ORAL_CAPSULE | Freq: Two times a day (BID) | ORAL | 0 refills | Status: AC

## 2022-08-05 MED ORDER — DOCUSATE SODIUM 100 MG PO CAPS: 100.0000 mg | ORAL_CAPSULE | Freq: Two times a day (BID) | ORAL | 0 refills | Status: AC

## 2022-08-05 MED ORDER — ENOXAPARIN SODIUM 40 MG/0.4ML IJ SOSY: 40.0000 mg | PREFILLED_SYRINGE | INTRAMUSCULAR | 0 refills | Status: DC

## 2022-08-05 MED ORDER — HYDROCODONE-ACETAMINOPHEN 5-325 MG PO TABS: 1.0000 | ORAL_TABLET | ORAL | 0 refills | Status: DC | PRN

## 2022-08-05 MED ORDER — FLEET ENEMA 7-19 GM/118ML RE ENEM: 1.0000 | ENEMA | Freq: Every day | RECTAL | Status: DC | PRN

## 2022-08-05 MED ORDER — BISACODYL 10 MG RE SUPP: 10.0000 mg | Freq: Every day | RECTAL | Status: DC | PRN

## 2022-08-05 NOTE — Plan of Care (Signed)
  Problem: Education: Goal: Verbalization of understanding the information provided (i.e., activity precautions, restrictions, etc) will improve Outcome: Progressing   Problem: Activity: Goal: Ability to ambulate and perform ADLs will improve Outcome: Progressing   Problem: Pain Management: Goal: Pain level will decrease Outcome: Progressing   

## 2022-08-05 NOTE — TOC Progression Note (Signed)
Transition of Care [facility]) - Progression Note    Patient Details  Name: Erika Knapp MRN: [id number] Date of Birth: Jul 29, 1966  Transition of Care [facility]) CM/SW Contact  Truddie Hidden, RN Phone Number: 08/05/2022, 3:52 PM  Clinical Narrative:    Spoke with patient. She is agreeable to SNF recommendation and [facility]. Auth started.   Contacted Jill Side at [facility]. They facility will take weekend discharges.          Expected Discharge Plan and Services                                                 Social Determinants of Health (SDOH) Interventions    Readmission Risk Interventions     No data to display

## 2022-08-05 NOTE — Plan of Care (Signed)

## 2022-08-05 NOTE — Discharge Summary (Signed)
Physician Discharge Summary  Patient ID: Erika Knapp MRN: [id number] DOB/AGE: [birth year] 56 y.o.  Admit date: 08/04/2022 Discharge date: 08/09/2022  Admission Diagnoses:  Closed fracture of right distal femur [facility]) [S72.401A]   Discharge Diagnoses: Patient Active Problem List   Diagnosis Date Noted   Closed fracture of right distal femur (HCC) 08/04/2022   Seizures (HCC) 04/10/2015    Past Medical History:  Diagnosis Date   Anxiety    Depression    High cholesterol    Hypertension    NSTEMI (non-ST elevated myocardial infarction) (HCC)    Pulmonary embolism (HCC) 2001   lung   Sleep apnea      Transfusion: none   Consultants (if any):   Discharged Condition: Improved  Hospital Course: Erika Knapp is an 56 y.o. female who was admitted 08/04/2022 with Knapp diagnosis of Closed fracture of right distal femur (HCC) and went to the operating room on 08/04/2022 and underwent the above named procedures.    Surgeries: Procedure(s): OPEN REDUCTION INTERNAL FIXATION (ORIF) DISTAL FEMUR FRACTURE on 08/04/2022 Patient tolerated the surgery well. Taken to PACU where she was stabilized and then transferred to the orthopedic floor.  Started on Lovenox 40 mg q 24 hrs. TEDs and SCDs applied bilaterally. Heels elevated on bed. No evidence of DVT. Negative Homan. Physical therapy started on day #1 for gait training and transfer. OT started day #1 for ADL and assisted devices.  Patient with slow progress with physical therapy.  Initially physical therapy recommended skilled nursing facility but patient refused skilled nursing facility.  Patient able to move safely and independently within the room.  PT changed recommendation to home with home health PT.  By postop day 5, patient was stable and ready for discharge to home with home health PT.  Patient making slow progress of physical therapy but with proper equipment such as wheelchair, bedside commode, walker patient able to safely  return home.  Patient's vital signs and labs are stable.  Pain well controlled.  Patient stable and ready for discharge to home with home health PT/OT.    Implants: Synthes 12 hole lateral condylar distal femur plate with 7.6HM cortex screws and 69mm VA locking screws.    She was given perioperative antibiotics:  Anti-infectives (From admission, onward)    Start     Dose/Rate Route Frequency Ordered Stop   08/04/22 1700  ceFAZolin (ANCEF) IVPB 2g/100 mL premix        2 g 200 mL/hr over 30 Minutes Intravenous Every 6 hours 08/04/22 1554 08/05/22 0049   08/04/22 0916  ceFAZolin (ANCEF) 2-4 GM/100ML-% IVPB       Note to Pharmacy: Erika Knapp: cabinet override      08/04/22 0916 08/04/22 1038   08/04/22 0600  ceFAZolin (ANCEF) IVPB 2g/100 mL premix        2 g 200 mL/hr over 30 Minutes Intravenous On call to O.R. 08/03/22 2309 08/04/22 1030     .  She was given sequential compression devices, early ambulation, and Lovenox for DVT prophylaxis.  She benefited maximally from the hospital stay and there were no complications.    Recent vital signs:  Vitals:   08/08/22 2349 08/09/22 0746  BP: 130/66   Pulse: 79   Resp: 16   Temp: 98.2 F (36.8 C) 98.3 F (36.8 C)  SpO2: 99%     Recent laboratory studies:  Lab Results  Component Value Date   HGB 9.4 (L) 08/07/2022   HGB 9.5 (L) 08/06/2022  HGB 10.1 (L) 08/05/2022   Lab Results  Component Value Date   WBC 6.7 08/07/2022   PLT 276 08/07/2022   Lab Results  Component Value Date   INR 0.9 07/01/2013   Lab Results  Component Value Date   NA 136 08/07/2022   K 4.4 08/07/2022   CL 106 08/07/2022   CO2 27 08/07/2022   BUN 8 08/07/2022   CREATININE 0.70 08/07/2022   GLUCOSE 102 (H) 08/07/2022    Discharge Medications:   Allergies as of 08/09/2022       Reactions   Tramadol Hives   Other reaction(s): HIVES        Medication List     TAKE these medications    aspirin EC 81 MG tablet Take 81 mg by  mouth daily. Swallow whole.   celecoxib 200 MG capsule Commonly known as: CeleBREX Take 1 capsule (200 mg total) by mouth 2 (two) times daily for 14 days.   diphenhydrAMINE 25 mg capsule Commonly known as: BENADRYL Take 1 capsule (25 mg total) by mouth every 6 (six) hours as needed for itching.   docusate sodium 100 MG capsule Commonly known as: COLACE Take 1 capsule (100 mg total) by mouth 2 (two) times daily.   enoxaparin 40 MG/0.4ML injection Commonly known as: LOVENOX Inject 0.4 mLs (40 mg total) into the skin daily for 14 days.   ergocalciferol 1.25 MG (50000 UT) capsule Commonly known as: VITAMIN D2 Take 50,000 Units by mouth once Knapp week.   HYDROcodone-acetaminophen 5-325 MG tablet Commonly known as: NORCO/VICODIN Take 1-2 tablets by mouth every 4 (four) hours as needed for moderate pain (pain score 4-6).   losartan 25 MG tablet Commonly known as: COZAAR Take 12.5 mg by mouth daily.   omeprazole 20 MG capsule Commonly known as: PRILOSEC Take 20 mg by mouth daily.   sertraline 50 MG tablet Commonly known as: ZOLOFT Take 50 mg by mouth at bedtime. Take 1 1/2  tablets               Durable Medical Equipment  (From admission, onward)           Start     Ordered   08/08/22 1441  For home use only DME wheelchair cushion (seat and back)  Once       Comments: bariatric   08/08/22 1441   08/08/22 1441  For home use only DME standard manual wheelchair with seat cushion  Once       Comments: Patient suffers from obesity with post op pain right distal femur fractures which impairs their ability to perform daily activities like bathing, dressing, feeding, grooming, and toileting in the home.  Knapp cane or crutch will not resolve issue with performing activities of daily living. Knapp wheelchair will allow patient to safely perform daily activities. Patient can safely propel the wheelchair in the home or has Knapp caregiver who can provide assistance. Length of need 6 months  .  bariatric Accessories: elevating leg rests (ELRs), wheel locks, extensions and anti-tippers.   08/08/22 1441   08/08/22 1439  For home use only DME Bedside commode  Once       Comments: bariatric  Question:  Patient needs Knapp bedside commode to treat with the following condition  Answer:  Acute leg pain, right   08/08/22 1441   08/08/22 1438  For home use only DME Walker rolling  Once       Comments: bariatric  Question Answer Comment  Walker: With  5 Inch Wheels   Patient needs Knapp walker to treat with the following condition Leg pain      08/08/22 1441            Diagnostic Studies: DG FEMUR, MIN 2 VIEWS RIGHT  Result Date: 08/04/2022 CLINICAL DATA:  Elective surgery. EXAM: RIGHT FEMUR 2 VIEWS COMPARISON:  Preoperative imaging. FINDINGS: Five fluoroscopic spot views obtained in the operating room. Lateral plate and multi screw fixation of distal femur fracture. Fluoroscopy time 3 minutes 3 seconds. Dose 34.88 mGy. IMPRESSION: Intraoperative fluoroscopy during distal femur fracture ORIF. Electronically Signed   By: Narda Rutherford M.D.   On: 08/04/2022 14:05   DG C-Arm 1-60 Min-No Report  Result Date: 08/04/2022 Fluoroscopy was utilized by the requesting physician.  No radiographic interpretation.   DG C-Arm 1-60 Min-No Report  Result Date: 08/04/2022 Fluoroscopy was utilized by the requesting physician.  No radiographic interpretation.   DG C-Arm 1-60 Min-No Report  Result Date: 08/04/2022 Fluoroscopy was utilized by the requesting physician.  No radiographic interpretation.   [place] OR NERVE BLOCK-IMAGE ONLY [facility])  Result Date: 08/04/2022 There is no interpretation for this exam.  This order is for images obtained during Knapp surgical procedure.  Please See "Surgeries" Tab for more information regarding the procedure.   CT KNEE RIGHT WO CONTRAST  Result Date: 08/02/2022 CLINICAL DATA:  Larey Seat.  Evaluate femur fracture. EXAM: CT OF THE RIGHT KNEE WITHOUT CONTRAST TECHNIQUE:  Multidetector CT imaging of the right knee was performed according to the standard protocol. Multiplanar CT image reconstructions were also generated. RADIATION DOSE REDUCTION: This exam was performed according to the departmental dose-optimization program which includes automated exposure control, adjustment of the mA and/or kV according to patient size and/or use of iterative reconstruction technique. COMPARISON:  Radiographs 07/26/2022 FINDINGS: Complex comminuted but relatively nondisplaced fractures involving distal femur. Periprosthetic epicondylar fractures both medially and laterally. There is also Knapp long longitudinal fracture involving the posterior cortex of the distal femoral shaft. The prosthesis is intact. No fracture of the tibia, fibula or patella. IMPRESSION: 1. Complex comminuted but relatively nondisplaced fractures involving the distal femur as detailed above. 2. No fracture of the tibia, fibula or patella. 3. No fracture of the tibia, fibula or patella. Electronically Signed   By: Rudie Meyer M.D.   On: 08/02/2022 12:35    Disposition: Discharge disposition: 06-Home-Health Care Svc          Contact information for follow-up providers     Evon Slack, PA-C Follow up in 2 week(s).   Specialties: Orthopedic Surgery, Emergency Medicine Contact information: [address] [place] [address] [phone]              Contact information for after-discharge care     Destination     [facility] .   Service: Skilled Nursing Contact information: [address] [place] [address] [phone]                      Signed: Patience Musca 08/09/2022, 8:15 AM

## 2022-08-05 NOTE — Evaluation (Signed)
Physical Therapy Evaluation Patient Details Name: Erika Knapp MRN: [id number] DOB: 1965/10/17 Today's Date: 08/05/2022  History of Present Illness  Pt is a 56 yo F with a mechanical fall diagnosed with right distal femur periprosthetic fracture and is s/p open reduction internal fixation.  PMH includes: CKD, depression, HTN, PE, and R TKA.   Clinical Impression  Pt was pleasant and motivated to participate during the session and put forth good effort throughout. Pt required significant time and effort with sup to sit and the use of her UEs to assist her RLE out of bed but was unable to get her RLE back into bed without min A.  Pt required significant physical assistance as well as the bed to be elevated during multiple sit to/from stand trials with cues for sequencing for WB compliance.  Once in standing pt was able to shuffle L foot 2 x 2 feet with seated rest break between and with min to mod A for stability and to guide the RW.  RLE NWB status maintained throughout the session.  Pt will benefit from PT services in a SNF setting upon discharge to safely address deficits listed in patient problem list for decreased caregiver assistance and eventual return to PLOF.         Recommendations for follow up therapy are one component of a multi-disciplinary discharge planning process, led by the attending physician.  Recommendations may be updated based on patient status, additional functional criteria and insurance authorization.  Follow Up Recommendations Skilled nursing-short term rehab (<3 hours/day) Can patient physically be transported by private vehicle: No    Assistance Recommended at Discharge Frequent or constant Supervision/Assistance  Patient can return home with the following  Two people to help with walking and/or transfers;A little help with bathing/dressing/bathroom;Assistance with cooking/housework;Assist for transportation;Help with stairs or ramp for entrance    Equipment  Recommendations Other (comment) (TBD at next venue of care)  Recommendations for Other Services       Functional Status Assessment Patient has had a recent decline in their functional status and demonstrates the ability to make significant improvements in function in a reasonable and predictable amount of time.     Precautions / Restrictions Precautions Precautions: Fall Required Braces or Orthoses: Knee Immobilizer - Right Knee Immobilizer - Right: On at all times Restrictions Weight Bearing Restrictions: Yes RLE Weight Bearing: Non weight bearing      Mobility  Bed Mobility Overal bed mobility: Needs Assistance Bed Mobility: Supine to Sit, Sit to Supine     Supine to sit: Supervision Sit to supine: Min assist   General bed mobility comments: Significant time and effort needed with bed mobility tasks with pt using BUEs to assist RLE out of bed but needed min A for RLE back into bed    Transfers Overall transfer level: Needs assistance Equipment used: Rolling walker (2 wheels) Transfers: Sit to/from Stand Sit to Stand: Mod assist, From elevated surface           General transfer comment: Mod verbal and visual cues for sequencing for WB compliance    Ambulation/Gait Ambulation/Gait assistance: Min assist, Mod assist Gait Distance (Feet): 2 Feet x 2 Assistive device: Rolling walker (2 wheels) Gait Pattern/deviations: Shuffle, Trunk flexed Gait velocity: decreased     General Gait Details: Pt able to take several small shuffling steps at the EOB with min to mod A for stability and mod verbal and tactile cues for sequencing for WB compliance and general safety with the RW  Stairs            Wheelchair Mobility    Modified Rankin (Stroke Patients Only)       Balance Overall balance assessment: Needs assistance   Sitting balance-Leahy Scale: Normal     Standing balance support: Bilateral upper extremity supported, During functional activity, Reliant  on assistive device for balance Standing balance-Leahy Scale: Poor                               Pertinent Vitals/Pain Pain Assessment Pain Assessment: 0-10 Pain Score: 7  Pain Location: RLE Pain Descriptors / Indicators: Sore, Aching Pain Intervention(s): Premedicated before session, Monitored during session    Home Living Family/patient expects to be discharged to:: Private residence Living Arrangements: Spouse/significant other Available Help at Discharge: Family;Available PRN/intermittently Type of Home: House Home Access: Ramped entrance       Home Layout: One level Home Equipment: Grab bars - tub/shower      Prior Function Prior Level of Function : Independent/Modified Independent             Mobility Comments: Ind amb community distances without an AD, no other fall history ADLs Comments: Ind with ADLs     Hand Dominance        Extremity/Trunk Assessment   Upper Extremity Assessment Upper Extremity Assessment: Generalized weakness    Lower Extremity Assessment Lower Extremity Assessment: Generalized weakness;RLE deficits/detail RLE: Unable to fully assess due to pain;Unable to fully assess due to immobilization       Communication   Communication: No difficulties  Cognition Arousal/Alertness: Awake/alert Behavior During Therapy: WFL for tasks assessed/performed Overall Cognitive Status: Within Functional Limits for tasks assessed                                          General Comments      Exercises Total Joint Exercises Ankle Circles/Pumps: AROM, Strengthening, 5 reps, 10 reps Quad Sets: Strengthening, Both, 5 reps, 10 reps Gluteal Sets: Strengthening, Both, 5 reps, 10 reps Heel Slides: AROM, Strengthening, Left, 5 reps Hip ABduction/ADduction: Strengthening, Both, 10 reps (AAROM on the RLE) Straight Leg Raises: Strengthening, Both, 10 reps (AAROM on the RLE) Long Arc Quad: AROM, Strengthening, Left, 10  reps Other Exercises Other Exercises: HEP education for BLE APs, QS, and GS x 10 each every 1-2 hours daily   Assessment/Plan    PT Assessment Patient needs continued PT services  PT Problem List Decreased strength;Decreased activity tolerance;Decreased balance;Decreased mobility;Decreased knowledge of use of DME;Pain;Decreased knowledge of precautions       PT Treatment Interventions DME instruction;Gait training;Functional mobility training;Therapeutic activities;Therapeutic exercise;Balance training;Patient/family education    PT Goals (Current goals can be found in the Care Plan section)  Acute Rehab PT Goals Patient Stated Goal: To be Ind around the house and to be able to clean PT Goal Formulation: With patient Time For Goal Achievement: 08/18/22 Potential to Achieve Goals: Good    Frequency BID     Co-evaluation               AM-PAC PT "6 Clicks" Mobility  Outcome Measure Help needed turning from your back to your side while in a flat bed without using bedrails?: A Little Help needed moving from lying on your back to sitting on the side of a flat bed without using bedrails?: A  Little Help needed moving to and from a bed to a chair (including a wheelchair)?: A Lot Help needed standing up from a chair using your arms (e.g., wheelchair or bedside chair)?: A Lot Help needed to walk in hospital room?: Total Help needed climbing 3-5 steps with a railing? : Total 6 Click Score: 12    End of Session Equipment Utilized During Treatment: Gait belt Activity Tolerance: Patient tolerated treatment well Patient left: in bed;with call bell/phone within reach;with bed alarm set;with SCD's reapplied;Other (comment) (Pt declined up in chair) Nurse Communication: Mobility status PT Visit Diagnosis: Unsteadiness on feet (R26.81);Other abnormalities of gait and mobility (R26.89);Pain Pain - Right/Left: Right Pain - part of body: Knee    Time: 0924-1010 PT Time Calculation (min)  (ACUTE ONLY): 46 min   Charges:   PT Evaluation $PT Eval Moderate Complexity: 1 Mod PT Treatments $Therapeutic Exercise: 8-22 mins $Therapeutic Activity: 8-22 mins       D. Scott Rhealyn Cullen PT, DPT 08/05/22, 11:06 AM

## 2022-08-05 NOTE — Discharge Instructions (Signed)
Instructions after distal femur open reduction with internal fixation        Dr. Regenia Skeeter., M.D.      Dept. of Orthopaedics & Sports Medicine  [facility]  [address]  [place], [place]  [address]  Phone: [phone]   Fax: [phone]    DIET: Drink plenty of non-alcoholic fluids. Resume your normal diet. Include foods high in fiber.  ACTIVITY:  You may use a walker.  Absolutely no weightbearing on the right lower extremity.  Use knee immobilizer while ambulating   Continue doing gentle nonweightbearing exercises. Exercising will reduce the pain and swelling, increase motion, and prevent muscle weakness.     WOUND CARE:  Continue to use ice packs periodically to reduce pain and swelling. You may shower with honeycomb dressing 3 days after surgery. Do not submerge incision site under water. Remove honeycomb dressing 7 days after surgery and allow dermabond to fall off on its own.   MEDICATIONS: You may resume your regular medications. Please take the pain medication as prescribed on the medication list. Do not take pain medication on an empty stomach. You have been given a prescription for a blood thinner to prevent blood clots. Please take the medication as instructed. (NOTE: After completing a 2 week course of Lovenox, take one Enteric-coated 81 mg aspirin twice a day.) Pain medications and iron supplements can cause constipation. Use a stool softener (Senokot or Colace) on a daily basis and a laxative (dulcolax or miralax) as needed. Do not drive or drink alcoholic beverages when taking pain medications.  CALL THE OFFICE FOR: Temperature above 101 degrees Excessive bleeding or drainage on the dressing. Excessive swelling, coldness, or paleness of the toes. Persistent nausea and vomiting.  FOLLOW-UP:  You should have an appointment to return to the office in 2 weeks after surgery. Arrangements have been made for continuation of Physical Therapy  (either home therapy or outpatient therapy).

## 2022-08-05 NOTE — NC FL2 (Signed)
[place] MEDICAID FL2 LEVEL OF CARE SCREENING TOOL     IDENTIFICATION  Patient Name: Erika Knapp Birthdate: Aug 27, 1966 Sex: female Admission Date (Current Location): 08/04/2022  [facility] and [place] Number:  Chiropodist and Address:  [facility], [address] [place], [place] [address]      Provider Number: [id number]  Attending Physician Name and Address:  Reinaldo Berber, MD  Relative Name and Phone Number:  Jessica Teel-Blue,[phone]    Current Level of Care: Hospital Recommended Level of Care: Skilled Nursing Facility Prior Approval Number:    Date Approved/Denied:   PASRR Number: [id number]  Discharge Plan: SNF    Current Diagnoses: Patient Active Problem List   Diagnosis Date Noted   Closed fracture of right distal femur (HCC) 08/04/2022   Seizures (HCC) 04/10/2015    Orientation RESPIRATION BLADDER Height & Weight     Self, Time, Situation, Place  Normal External catheter Weight: 129.3 kg Height:  5\' 6"  (167.6 cm)  BEHAVIORAL SYMPTOMS/MOOD NEUROLOGICAL BOWEL NUTRITION STATUS  Other (Comment) (n/a)   Continent Diet  AMBULATORY STATUS COMMUNICATION OF NEEDS Skin   Limited Assist Verbally Bruising (R arm and bilateral hips, Incision R knee)                       Personal Care Assistance Level of Assistance  Bathing, Dressing Bathing Assistance: Limited assistance   Dressing Assistance: Limited assistance     Functional Limitations Info             SPECIAL CARE FACTORS FREQUENCY  PT (By licensed PT)     PT Frequency: 2x min weekly              Contractures Contractures Info: Not present    Additional Factors Info  Code Status, Allergies Code Status Info: FULL Allergies Info: Tramadol           Current Medications (08/05/2022):  This is the current hospital active medication list Current Facility-Administered Medications  Medication Dose Route Frequency Provider Last Rate Last  Admin   0.9 %  sodium chloride infusion   Intravenous Continuous 13/06/2022, MD   Stopped at 08/04/22 1620   acetaminophen (TYLENOL) tablet 1,000 mg  1,000 mg Oral Q8H 13/09/23, MD   1,000 mg at 08/04/22 2121   diphenhydrAMINE (BENADRYL) capsule 25 mg  25 mg Oral Q6H PRN 2122, MD   25 mg at 08/05/22 1209   docusate sodium (COLACE) capsule 100 mg  100 mg Oral BID 1210, MD   100 mg at 08/05/22 0925   enoxaparin (LOVENOX) injection 40 mg  40 mg Subcutaneous Q24H 13/10/23, MD   40 mg at 08/05/22 0924   HYDROcodone-acetaminophen (NORCO) 7.5-325 MG per tablet 1-2 tablet  1-2 tablet Oral Q4H PRN 01-24-1977, MD   2 tablet at 08/05/22 1208   HYDROcodone-acetaminophen (NORCO/VICODIN) 5-325 MG per tablet 1-2 tablet  1-2 tablet Oral Q4H PRN 1209, MD   2 tablet at 08/05/22 13/10/23   menthol-cetylpyridinium (CEPACOL) lozenge 3 mg  1 lozenge Oral PRN 4034, MD       Or   phenol (CHLORASEPTIC) mouth spray 1 spray  1 spray Mouth/Throat PRN Reinaldo Berber, MD       metoCLOPramide (REGLAN) tablet 5-10 mg  5-10 mg Oral Q8H PRN Reinaldo Berber, MD       Or   metoCLOPramide (REGLAN) injection 5-10 mg  5-10 mg Intravenous Q8H PRN 7-10, MD  morphine (PF) 2 MG/ML injection 0.5-1 mg  0.5-1 mg Intravenous Q2H PRN Reinaldo Berber, MD   1 mg at 08/04/22 1750   ondansetron (ZOFRAN) tablet 4 mg  4 mg Oral Q6H PRN Reinaldo Berber, MD       Or   ondansetron (ZOFRAN) injection 4 mg  4 mg Intravenous Q6H PRN Reinaldo Berber, MD       pantoprazole (PROTONIX) EC tablet 40 mg  40 mg Oral Daily Reinaldo Berber, MD   40 mg at 08/05/22 9357     Discharge Medications: Please see discharge summary for a list of discharge medications.  Relevant Imaging Results:  Relevant Lab Results:   Additional Information SS# [id number]  Truddie Hidden, RN

## 2022-08-05 NOTE — Progress Notes (Signed)
Pt pod 1 from right hip, unable to have BM. All bowels are audible in all quadrants and passing gas. Contacted Dr. Joice Lofts; orders received for daily PRN.

## 2022-08-05 NOTE — Progress Notes (Signed)
   Subjective: 1 Day Post-Op Procedure(s) (LRB): OPEN REDUCTION INTERNAL FIXATION (ORIF) DISTAL FEMUR FRACTURE (Right) Patient reports pain as mild.   Patient is well, and has had no acute complaints or problems Denies any CP, SOB, ABD pain. We will start therapy today.   Objective: Vital signs in last 24 hours: Temp:  [97 F (36.1 C)-98.8 F (37.1 C)] 98 F (36.7 C) (11/10 0813) Pulse Rate:  [67-90] 80 (11/10 0813) Resp:  [14-20] 16 (11/10 0813) BP: (129-170)/(64-100) 132/64 (11/10 0813) SpO2:  [91 %-100 %] 98 % (11/10 0813) Weight:  [129.3 kg] 129.3 kg (11/09 0844)  Intake/Output from previous day: 11/09 0701 - 11/10 0700 In: 2216.7 [P.O.:240; I.V.:1601.3; IV Piggyback:375.4] Out: 1060 [Urine:860; Blood:200] Intake/Output this shift: No intake/output data recorded.  Recent Labs    08/04/22 0854 08/05/22 0627  HGB 13.0 10.1*   Recent Labs    08/04/22 0854 08/05/22 0627  WBC 6.7 6.7  RBC 4.24 3.23*  HCT 39.7 30.4*  PLT 315 271   Recent Labs    08/04/22 0854 08/05/22 0627  NA 140 135  K 3.7 4.3  CL 102 105  CO2 28 27  BUN 7 10  CREATININE 0.71 0.84  GLUCOSE 113* 115*  CALCIUM 8.9 8.1*   No results for input(s): "LABPT", "INR" in the last 72 hours.  EXAM General - Patient is Alert, Appropriate, and Oriented Extremity - Neurovascular intact Sensation intact distally Intact pulses distally Dorsiflexion/Plantar flexion intact Dressing - dressing C/D/I and no drainage, knee immobilizer intact Motor Function - intact, moving foot and toes well on exam.   Past Medical History:  Diagnosis Date   Anxiety    Depression    High cholesterol    Hypertension    NSTEMI (non-ST elevated myocardial infarction) (HCC)    Pulmonary embolism (HCC) 2001   lung   Sleep apnea     Assessment/Plan:   1 Day Post-Op Procedure(s) (LRB): OPEN REDUCTION INTERNAL FIXATION (ORIF) DISTAL FEMUR FRACTURE (Right) Principal Problem:   Closed fracture of right distal femur  (HCC)  Estimated body mass index is 46 kg/m as calculated from the following:   Height as of this encounter: 5\' 6"  (1.676 m).   Weight as of this encounter: 129.3 kg. Advance diet Up with therapy, nonweightbearing right lower extremity.  Maintain knee immobilizer with ambulation Vital signs are stable Labs are stable Pain well controlled Care management to assist with discharge.  Patient with a little help at home.  With her nonweightbearing status may need skilled nursing facility.  Will monitor progress with physical therapy.  DVT Prophylaxis - Lovenox and SCD's Nonweightbearing right lower extremity   T. , PA-C [facility] 08/05/2022, 8:42 AM

## 2022-08-05 NOTE — Progress Notes (Signed)
Physical Therapy Treatment Patient Details Name: Erika Knapp MRN: [id number] DOB: 1965-10-21 Today's Date: 08/05/2022   History of Present Illness Pt is a 56 yo F with a mechanical fall diagnosed with right distal femur periprosthetic fracture and is s/p open reduction internal fixation.  PMH includes: CKD, depression, HTN, PE, and R TKA.    PT Comments    Pt was pleasant and put forth good effort throughout the session.  Pt reported no increase in RLE pain during the session but declined standing activity or to attempt to get to chair secondary to concerns for increased RLE pain in standing.  Pt required grossly less physical assistance with bed mobility tasks this session and was able to laterally scoot at the EOB with SBA.  Pt's SpO2 and HR were both WNL on room air.  Pt will benefit from PT services in a SNF setting upon discharge to safely address deficits listed in patient problem list for decreased caregiver assistance and eventual return to PLOF.     Recommendations for follow up therapy are one component of a multi-disciplinary discharge planning process, led by the attending physician.  Recommendations may be updated based on patient status, additional functional criteria and insurance authorization.  Follow Up Recommendations  Skilled nursing-short term rehab (<3 hours/day) Can patient physically be transported by private vehicle: No   Assistance Recommended at Discharge Frequent or constant Supervision/Assistance  Patient can return home with the following Two people to help with walking and/or transfers;A little help with bathing/dressing/bathroom;Assistance with cooking/housework;Assist for transportation;Help with stairs or ramp for entrance   Equipment Recommendations  Other (comment) (TBD at next venue of care)    Recommendations for Other Services       Precautions / Restrictions Precautions Precautions: Fall Required Braces or Orthoses: Knee Immobilizer -  Right Knee Immobilizer - Right: On at all times Restrictions Weight Bearing Restrictions: Yes RLE Weight Bearing: Non weight bearing     Mobility  Bed Mobility Overal bed mobility: Needs Assistance Bed Mobility: Supine to Sit, Sit to Supine     Supine to sit: Supervision Sit to supine: Supervision   General bed mobility comments: Significant time and effort needed with bed mobility tasks with pt using BUEs to assist RLE but no physical assistance needed this session    Transfers Overall transfer level: Needs assistance Equipment used: None Transfers: Sit to/from Stand Sit to Stand: Mod assist, From elevated surface          Lateral/Scoot Transfers: Supervision General transfer comment: Pt declined to come to standing this session secondary to RLE pain but was able to laterally scoot 2-3 feet with SBA and cues for sequencing    Ambulation/Gait Ambulation/Gait assistance: Min assist, Mod assist Gait Distance (Feet): 2 Feet Assistive device: Rolling walker (2 wheels) Gait Pattern/deviations: Shuffle, Trunk flexed Gait velocity: decreased     General Gait Details: Pt declined ambulation this session   Stairs             Wheelchair Mobility    Modified Rankin (Stroke Patients Only)       Balance Overall balance assessment: Needs assistance   Sitting balance-Leahy Scale: Normal     Standing balance support: Bilateral upper extremity supported, During functional activity, Reliant on assistive device for balance Standing balance-Leahy Scale: Poor                              Cognition Arousal/Alertness: Awake/alert Behavior During Therapy:  WFL for tasks assessed/performed Overall Cognitive Status: Within Functional Limits for tasks assessed                                          Exercises Total Joint Exercises Ankle Circles/Pumps: AROM, Strengthening, 5 reps, 10 reps (with manual resistance on the L) Quad Sets:  Strengthening, Both, 5 reps, 10 reps Gluteal Sets: Strengthening, Both, 5 reps, 10 reps Heel Slides: AROM, Strengthening, Left, 10 reps Hip ABduction/ADduction: Strengthening, Both, 10 reps (AAROM on the LLE) Straight Leg Raises: Strengthening, Both, 10 reps (AAROM on the LLE) [employer]: Strengthening, Left, 10 reps (with manual resistance) Knee Flexion: Strengthening, Left, 10 reps (with manual resistance) Bridges: Left, 10 reps, Supine Other Exercises Other Exercises: HEP education/review for BLE APs, QS, and GS x 10 each every 1-2 hours daily and BLE SLR x 10 twice daily; LLE bridges x 10 twice daily    General Comments        Pertinent Vitals/Pain Pain Assessment Pain Assessment: 0-10 Pain Score: 7  Pain Location: RLE Pain Descriptors / Indicators: Sore, Aching Pain Intervention(s): Premedicated before session, Monitored during session, Repositioned, Limited activity within patient's tolerance    Home Living                          Prior Function            PT Goals (current goals can now be found in the care plan section) Progress towards PT goals: Progressing toward goals    Frequency    BID      PT Plan Current plan remains appropriate    Co-evaluation              AM-PAC PT "6 Clicks" Mobility   Outcome Measure  Help needed turning from your back to your side while in a flat bed without using bedrails?: A Little Help needed moving from lying on your back to sitting on the side of a flat bed without using bedrails?: A Little Help needed moving to and from a bed to a chair (including a wheelchair)?: A Lot Help needed standing up from a chair using your arms (e.g., wheelchair or bedside chair)?: A Lot Help needed to walk in hospital room?: Total Help needed climbing 3-5 steps with a railing? : Total 6 Click Score: 12    End of Session   Activity Tolerance: Patient tolerated treatment well Patient left: in bed;with call bell/phone  within reach;with bed alarm set;with SCD's reapplied;Other (comment) (Pt declined up in chair) Nurse Communication: Mobility status PT Visit Diagnosis: Unsteadiness on feet (R26.81);Other abnormalities of gait and mobility (R26.89);Pain Pain - Right/Left: Right Pain - part of body: Knee     Time: 1308-6578 PT Time Calculation (min) (ACUTE ONLY): 24 min  Charges:  $Therapeutic Exercise: 8-22 mins $Therapeutic Activity: 8-22 mins                    D. Scott Malaki Koury PT, DPT 08/05/22, 3:35 PM

## 2022-08-05 NOTE — TOC Initial Note (Signed)
Transition of Care [facility]) - Initial/Assessment Note    Patient Details  Name: Erika Knapp MRN: [id number] Date of Birth: 1966-03-15  [facility]) CM/SW Contact:    Truddie Hidden, RN Phone Number: 08/05/2022, 1:30 PM  Clinical Narrative:                  Per therapy note SNF recommended. Patient agreeable. She would prefer [facility].   [place] PASSR obtained. FL2 completed. Bed search started.         Patient Goals and CMS Choice        Expected Discharge Plan and Services                                                Prior Living Arrangements/Services                       Activities of Daily Living Home Assistive Devices/Equipment: CPAP ADL Screening (condition at time of admission) Patient's cognitive ability adequate to safely complete daily activities?: Yes Is the patient deaf or have difficulty hearing?: No Does the patient have difficulty seeing, even when wearing glasses/contacts?: No Does the patient have difficulty concentrating, remembering, or making decisions?: No Patient able to express need for assistance with ADLs?: Yes Does the patient have difficulty dressing or bathing?: No Independently performs ADLs?: Yes (appropriate for developmental age) Does the patient have difficulty walking or climbing stairs?: Yes Weakness of Legs: Right Weakness of Arms/Hands: None  Permission Sought/Granted                  Emotional Assessment              Admission diagnosis:  Closed fracture of right distal femur [facility]) [S72.401A] Patient Active Problem List   Diagnosis Date Noted   Closed fracture of right distal femur (HCC) 08/04/2022   Seizures (HCC) 04/10/2015   PCP:  [facility] Pharmacy:   [id number] [place], [place] - [address] AT [facility] [address] [place] [address] Phone: [phone] Fax: [phone]  [facility] [place], [place] - [address] [place] [address] Phone: [phone] Fax: [phone]  [facility] - [place], [place] - [address] [place] [address] Phone: [phone] Fax: [phone]     Social Determinants of Health (SDOH) Interventions    Readmission Risk Interventions     No data to display

## 2022-08-06 LAB — CBC
HCT: 28.8 % — ABNORMAL LOW (ref 36.0–46.0)
Hemoglobin: 9.5 g/dL — ABNORMAL LOW (ref 12.0–15.0)
MCH: 30.8 pg (ref 26.0–34.0)
MCHC: 33 g/dL (ref 30.0–36.0)
MCV: 93.5 fL (ref 80.0–100.0)
Platelets: 273 10*3/uL (ref 150–400)
RBC: 3.08 MIL/uL — ABNORMAL LOW (ref 3.87–5.11)
RDW: 12.6 % (ref 11.5–15.5)
WBC: 7 10*3/uL (ref 4.0–10.5)
nRBC: 0 % (ref 0.0–0.2)

## 2022-08-06 LAB — BASIC METABOLIC PANEL
Anion gap: 5 (ref 5–15)
BUN: 12 mg/dL (ref 6–20)
CO2: 27 mmol/L (ref 22–32)
Calcium: 8 mg/dL — ABNORMAL LOW (ref 8.9–10.3)
Chloride: 107 mmol/L (ref 98–111)
Creatinine, Ser: 0.76 mg/dL (ref 0.44–1.00)
GFR, Estimated: >60 mL/min (ref >60)
Glucose, Bld: 98 mg/dL (ref 70–99)
Potassium: 3.8 mmol/L (ref 3.5–5.1)
Sodium: 139 mmol/L (ref 135–145)

## 2022-08-06 NOTE — Plan of Care (Signed)
  Problem: Education: Goal: Verbalization of understanding the information provided (i.e., activity precautions, restrictions, etc) will improve Outcome: Progressing   Problem: Activity: Goal: Ability to ambulate and perform ADLs will improve Outcome: Progressing   Problem: Pain Management: Goal: Pain level will decrease Outcome: Progressing   

## 2022-08-06 NOTE — Progress Notes (Addendum)
Subjective: 2 Days Post-Op Procedure(s) (LRB): OPEN REDUCTION INTERNAL FIXATION (ORIF) DISTAL FEMUR FRACTURE (Right) Patient reports pain as moderate.   Does report more pain this morning than yesterday. Patient is well, and has had no acute complaints or problems Denies any CP, SOB, ABD pain. Continue PT today, plan is for discharge to SNF. Continue to work on BM.  Objective: Vital signs in last 24 hours: Temp:  [98 F (36.7 C)-98.3 F (36.8 C)] 98.3 F (36.8 C) (11/11 0007) Pulse Rate:  [74-89] 74 (11/11 0007) Resp:  [16-17] 17 (11/11 0007) BP: (111-132)/(59-64) 119/59 (11/11 0007) SpO2:  [96 %-100 %] 100 % (11/11 0007)  Intake/Output from previous day: 11/10 0701 - 11/11 0700 In: 480 [P.O.:480] Out: -  Intake/Output this shift: No intake/output data recorded.  Recent Labs    08/04/22 0854 08/05/22 0627 08/06/22 0429  HGB 13.0 10.1* 9.5*   Recent Labs    08/05/22 0627 08/06/22 0429  WBC 6.7 7.0  RBC 3.23* 3.08*  HCT 30.4* 28.8*  PLT 271 273   Recent Labs    08/05/22 0627 08/06/22 0429  NA 135 139  K 4.3 3.8  CL 105 107  CO2 27 27  BUN 10 12  CREATININE 0.84 0.76  GLUCOSE 115* 98  CALCIUM 8.1* 8.0*   No results for input(s): "LABPT", "INR" in the last 72 hours.  EXAM General - Patient is Alert, Appropriate, and Oriented Extremity - Neurovascular intact Sensation intact distally Intact pulses distally Dorsiflexion/Plantar flexion intact Negative homans test to bilateral lower extremities. Dressing - dressing C/D/I and no drainage, knee immobilizer intact to the right leg, knee immobilizer was re-positioned this AM. Motor Function - intact, moving foot and toes well on exam.  Intact bowel sounds this morning, soft to palpation.  Past Medical History:  Diagnosis Date   Anxiety    Depression    High cholesterol    Hypertension    NSTEMI (non-ST elevated myocardial infarction) (HCC)    Pulmonary embolism (HCC) 2001   lung   Sleep apnea      Assessment/Plan:   2 Days Post-Op Procedure(s) (LRB): OPEN REDUCTION INTERNAL FIXATION (ORIF) DISTAL FEMUR FRACTURE (Right) Principal Problem:   Closed fracture of right distal femur (HCC)  Estimated body mass index is 46 kg/m as calculated from the following:   Height as of this encounter: 5\' 6"  (1.676 m).   Weight as of this encounter: 129.3 kg. Advance diet Up with therapy, nonweightbearing right lower extremity.  Maintain knee immobilizer with ambulation Vital signs are stable Labs are stable, Hg 9.5 Pain well controlled Care management to assist with discharge.  Insurance has been started for discharge to SNF.  Upon discharge, continue lovenox for two weeks. Follow-up with [facility] in two weeks for skin check and x-rays.  DVT Prophylaxis - Lovenox and SCD's Nonweightbearing right lower extremity  J. [facility], PA-C [facility] 08/06/2022, 7:47 AM

## 2022-08-06 NOTE — TOC Progression Note (Signed)
Transition of Care [facility]) - Progression Note    Patient Details  Name: Erika Knapp MRN: [id number] Date of Birth: September 04, 1966  [facility] CM/SW Contact  Bing Quarry, RN Phone Number: 08/06/2022, 3:39 PM  Clinical Narrative: 11/11: Insurance authorization approved and is good 11/10 to 11/14. [facility] unable to [place] of [place] till Monday per [employer] supervisor. Provider updated. [place] Auth ID. #[id number]. Gabriel Cirri RN CM         Expected Discharge Plan and Services                                                 Social Determinants of Health (SDOH) Interventions    Readmission Risk Interventions     No data to display

## 2022-08-06 NOTE — Progress Notes (Signed)
Physical Therapy Treatment Patient Details Name: Erika Knapp MRN: [id number] DOB: 04-04-1966 Today's Date: 08/06/2022   History of Present Illness Pt is a 56 yo F with a mechanical fall diagnosed with right distal femur periprosthetic fracture and is s/p open reduction internal fixation.  PMH includes: CKD, depression, HTN, PE, and R TKA.    PT Comments    Pt was long sitting in bed upon arriving. She endorses feeling better today than previous date. Supportive spouse arrived during session. Author adjusted knee immobilizer to proper fit prior to OOB activity. Pt was easily able to progress to EOB short sit with increased time only. Stood from elevated bed height with min assist but required mod assist to stand form standard height. She did well maintaining nWB but fatigued quickly in standing. She was able to safely pivot to recliner with vcs for technique and posture correction. Once in recliner, reviewed exercises positioning, and icing  to promote healing. She states understanding but Thereasa Parkin will bring HEP this PM. Currently PT continues to recommend SNF until she can demonstrate improved transfers. If pt is able to perform transfers with less assistance, Pt may advance to being able to DC home with HHPT. She would need w/c, RW, and  elevated BSC. Pt is currently agreeable to SNF at DC. Does have ramp entry to home.    Recommendations for follow up therapy are one component of a multi-disciplinary discharge planning process, led by the attending physician.  Recommendations may be updated based on patient status, additional functional criteria and insurance authorization.  Follow Up Recommendations  Skilled nursing-short term rehab (<3 hours/day) Can patient physically be transported by private vehicle: No   Assistance Recommended at Discharge Frequent or constant Supervision/Assistance  Patient can return home with the following A lot of help with walking and/or transfers;A lot of help  with bathing/dressing/bathroom;Assistance with cooking/housework;Assistance with feeding;Direct supervision/assist for medications management;Direct supervision/assist for financial management;Assist for transportation;Help with stairs or ramp for entrance   Equipment Recommendations  Other (comment) (ongoing assessment)       Precautions / Restrictions Precautions Precautions: Fall Required Braces or Orthoses: Knee Immobilizer - Right Knee Immobilizer - Right: On at all times Restrictions Weight Bearing Restrictions: Yes RLE Weight Bearing: Non weight bearing     Mobility  Bed Mobility Overal bed mobility: Needs Assistance Bed Mobility: Supine to Sit, Sit to Supine  Supine to sit: Supervision  General bed mobility comments: no physical assistance required to exit R side of bed    Transfers Overall transfer level: Needs assistance Equipment used: Rolling walker (2 wheels) Transfers: Sit to/from Stand Sit to Stand: Mod assist, From elevated surface  General transfer comment: pt stood 3 x from progressively lowered surface height. Continues to require extensive assist to stand from lower surfaces    Ambulation/Gait Ambulation/Gait assistance: Min assist, Mod assist Gait Distance (Feet): 3 Feet Assistive device: Rolling walker (2 wheels) Gait Pattern/deviations: Shuffle, Trunk flexed Gait velocity: decreased     General Gait Details: pt struggles to "Hop" but was able to pivot on LLE. She fatigues quickly in standing. Prior to standing and ambulating, author adjusted knee immobilizer to proper fit.    Balance Overall balance assessment: Needs assistance Sitting-balance support: Bilateral upper extremity supported (LLE support) Sitting balance-Leahy Scale: Normal     Standing balance support: Bilateral upper extremity supported, During functional activity, Reliant on assistive device for balance Standing balance-Leahy Scale: Poor Standing balance comment: reliant on RW       Cognition  Arousal/Alertness: Awake/alert Behavior During Therapy: WFL for tasks assessed/performed Overall Cognitive Status: Within Functional Limits for tasks assessed      General Comments: Pt is A and O x 4. supportive spouse in room.           General Comments General comments (skin integrity, edema, etc.): Discussed importance of elevation, icing, and ther ex to promote healing. She performed several exercises and tolerated well. Will bring HEP to pt in PM session      Pertinent Vitals/Pain Pain Assessment Pain Assessment: 0-10 Pain Score: 4  Pain Location: RLE Pain Descriptors / Indicators: Sore, Aching Pain Intervention(s): Limited activity within patient's tolerance, Monitored during session, Premedicated before session, Repositioned, Ice applied     PT Goals (current goals can now be found in the care plan section) Acute Rehab PT Goals Patient Stated Goal: go home ASAP Progress towards PT goals: Progressing toward goals    Frequency    BID      PT Plan Current plan remains appropriate       AM-PAC PT "6 Clicks" Mobility   Outcome Measure  Help needed turning from your back to your side while in a flat bed without using bedrails?: A Little Help needed moving from lying on your back to sitting on the side of a flat bed without using bedrails?: A Little Help needed moving to and from a bed to a chair (including a wheelchair)?: A Lot Help needed standing up from a chair using your arms (e.g., wheelchair or bedside chair)?: A Lot Help needed to walk in hospital room?: A Lot Help needed climbing 3-5 steps with a railing? : Total 6 Click Score: 13    End of Session   Activity Tolerance: Patient tolerated treatment well;Patient limited by fatigue Patient left: in chair;with call bell/phone within reach;with chair alarm set;with family/visitor present Nurse Communication: Mobility status PT Visit Diagnosis: Unsteadiness on feet (R26.81);Other  abnormalities of gait and mobility (R26.89);Pain Pain - Right/Left: Right Pain - part of body: Knee     Time: 1040-1058 PT Time Calculation (min) (ACUTE ONLY): 18 min  Charges:  $Therapeutic Activity: 8-22 mins                     Jetta Lout PTA 08/06/22, 2:11 PM

## 2022-08-06 NOTE — Progress Notes (Signed)
PT Cancellation Note  Patient Details Name: Erika Knapp MRN: [id number] DOB: Mar 28, 1966   Cancelled Treatment:     PT attempt for PM session. Patient refused. She was asleep upon arriving. Does easily awake but she quickly states," I'm done for the day, I need to rest." Chartered loss adjuster issued her a HEP handout and recommended she perform later in the day. " Ill be ready to do more tomorrow."     [name] 08/06/2022, 2:16 PM

## 2022-08-06 NOTE — Progress Notes (Signed)
Pt POD 2 from right hip; declined PRN daily enema/supp/oral for now may take later on dayshift; educated pt about factors slowing down bowel moments. Will update dayshift RN.

## 2022-08-07 LAB — BASIC METABOLIC PANEL
Anion gap: 3 — ABNORMAL LOW (ref 5–15)
BUN: 8 mg/dL (ref 6–20)
CO2: 27 mmol/L (ref 22–32)
Calcium: 7.8 mg/dL — ABNORMAL LOW (ref 8.9–10.3)
Chloride: 106 mmol/L (ref 98–111)
Creatinine, Ser: 0.7 mg/dL (ref 0.44–1.00)
GFR, Estimated: >60 mL/min (ref >60)
Glucose, Bld: 102 mg/dL — ABNORMAL HIGH (ref 70–99)
Potassium: 4.4 mmol/L (ref 3.5–5.1)
Sodium: 136 mmol/L (ref 135–145)

## 2022-08-07 LAB — CBC
HCT: 28.8 % — ABNORMAL LOW (ref 36.0–46.0)
Hemoglobin: 9.4 g/dL — ABNORMAL LOW (ref 12.0–15.0)
MCH: 31.1 pg (ref 26.0–34.0)
MCHC: 32.6 g/dL (ref 30.0–36.0)
MCV: 95.4 fL (ref 80.0–100.0)
Platelets: 276 10*3/uL (ref 150–400)
RBC: 3.02 MIL/uL — ABNORMAL LOW (ref 3.87–5.11)
RDW: 12.7 % (ref 11.5–15.5)
WBC: 6.7 10*3/uL (ref 4.0–10.5)
nRBC: 0 % (ref 0.0–0.2)

## 2022-08-07 NOTE — Progress Notes (Signed)
Physical Therapy Treatment Patient Details Name: Erika Knapp MRN: [id number] DOB: Oct 09, 1965 Today's Date: 08/07/2022   History of Present Illness Pt is a 56 yo F with a mechanical fall diagnosed with right distal femur periprosthetic fracture and is s/p open reduction internal fixation.  PMH includes: CKD, depression, HTN, PE, and R TKA.    PT Comments    Pt stated upon entering room ""I'm going home, I'm not going to rehab".  Stated she is moving better in room and comfortable with discharge home.  She did her HEP on her own prior.  Get to EOB with rail but no assist.  Stood and is able to transfer to chair, BSC then back to bed with RW and min guard/supervision.  KI is adjusted for proper fit as it slid down with mobility.  She is able to recall WB status - NWB and is cues to maintain but does seem to put more weight on it during mobility.  She stated "not that much" and reviewed risks/benefits of following NWB orders.  Voiced understanding.  Pt will need wheelchair with elevating legs rests - may need wide width, RW and 3-in 1 commode chair.   Patient suffers from periprosthetic fx and NWB which impairs his/her ability to perform daily activities like toileting, feeding, dressing, grooming, bathing in the home. A cane, walker, crutch will not resolve the patient's issue with performing activities of daily living. A lightweight wheelchair and cushion is required/recommended and will allow patient to safely perform daily activities.   Patient can safely propel the wheelchair in the home or has a caregiver who can provide assistance.   She is agreeable to HHPT.  Will reach out to [facility] today.     Recommendations for follow up therapy are one component of a multi-disciplinary discharge planning process, led by the attending physician.  Recommendations may be updated based on patient status, additional functional criteria and insurance authorization.  Follow Up Recommendations  Home  health PT Can patient physically be transported by private vehicle: No   Assistance Recommended at Discharge Frequent or constant Supervision/Assistance  Patient can return home with the following Assistance with cooking/housework;Assistance with feeding;Direct supervision/assist for medications management;Direct supervision/assist for financial management;Assist for transportation;Help with stairs or ramp for entrance;A little help with walking and/or transfers;A little help with bathing/dressing/bathroom   Equipment Recommendations  Rolling walker (2 wheels);BSC/3in1;Wheelchair (measurements PT);Wheelchair cushion (measurements PT)    Recommendations for Other Services       Precautions / Restrictions Precautions Precautions: Fall Required Braces or Orthoses: Knee Immobilizer - Right Knee Immobilizer - Right: On at all times Restrictions Weight Bearing Restrictions: Yes RLE Weight Bearing: Non weight bearing     Mobility  Bed Mobility Overal bed mobility: Needs Assistance Bed Mobility: Supine to Sit, Sit to Supine     Supine to sit: Supervision     General bed mobility comments: no physical assistance required to exit R side of bed    Transfers Overall transfer level: Needs assistance Equipment used: Rolling walker (2 wheels) Transfers: Sit to/from Stand Sit to Stand: From elevated surface, Supervision, Modified independent (Device/Increase time)   Step pivot transfers: Supervision, Modified independent (Device/Increase time)       General transfer comment: pt stood 3 x from progressively lowered surface height. Continues to require extensive assist to stand from lower surfaces    Ambulation/Gait Ambulation/Gait assistance: Supervision, Modified independent (Device/Increase time) Gait Distance (Feet): 3 Feet Assistive device: Rolling walker (2 wheels) Gait Pattern/deviations: Shuffle, Trunk flexed  Gait velocity: decreased     General Gait Details: adjusted  knee brace during session.  pt reviewed and able to recall NWb but doubtful if she actually follows for transfers   Stairs             Wheelchair Mobility    Modified Rankin (Stroke Patients Only)       Balance Overall balance assessment: Needs assistance Sitting-balance support: Bilateral upper extremity supported Sitting balance-Leahy Scale: Normal     Standing balance support: Bilateral upper extremity supported, During functional activity, Reliant on assistive device for balance Standing balance-Leahy Scale: Good Standing balance comment: reliant on RW                            Cognition Arousal/Alertness: Awake/alert Behavior During Therapy: WFL for tasks assessed/performed Overall Cognitive Status: Within Functional Limits for tasks assessed                                          Exercises Other Exercises Other Exercises: did HEP on her on before and is completing after session.  has packet in hand to follow.    General Comments        Pertinent Vitals/Pain Pain Assessment Pain Assessment: Faces Faces Pain Scale: Hurts a little bit Pain Location: RLE Pain Descriptors / Indicators: Sore, Aching Pain Intervention(s): Limited activity within patient's tolerance, Monitored during session, Repositioned    Home Living                          Prior Function            PT Goals (current goals can now be found in the care plan section) Progress towards PT goals: Progressing toward goals    Frequency    BID      PT Plan Discharge plan needs to be updated    Co-evaluation              AM-PAC PT "6 Clicks" Mobility   Outcome Measure  Help needed turning from your back to your side while in a flat bed without using bedrails?: None Help needed moving from lying on your back to sitting on the side of a flat bed without using bedrails?: A Little Help needed moving to and from a bed to a chair  (including a wheelchair)?: A Little Help needed standing up from a chair using your arms (e.g., wheelchair or bedside chair)?: A Little Help needed to walk in hospital room?: A Little Help needed climbing 3-5 steps with a railing? : Total 6 Click Score: 17    End of Session   Activity Tolerance: Patient tolerated treatment well;Patient limited by fatigue Patient left: in bed;with call bell/phone within reach;with bed alarm set Nurse Communication: Mobility status PT Visit Diagnosis: Unsteadiness on feet (R26.81);Other abnormalities of gait and mobility (R26.89);Pain Pain - Right/Left: Right Pain - part of body: Knee     Time: 0215-0233 PT Time Calculation (min) (ACUTE ONLY): 18 min  Charges:  $Therapeutic Activity: 8-22 mins                   Danielle Dess, PTA 08/07/22, 2:53 PM

## 2022-08-07 NOTE — Plan of Care (Signed)
  Problem: Activity: Goal: Ability to ambulate and perform ADLs will improve Outcome: Progressing   Problem: Clinical Measurements: Goal: Postoperative complications will be avoided or minimized Outcome: Progressing   Problem: Pain Management: Goal: Pain level will decrease Outcome: Progressing   

## 2022-08-07 NOTE — Progress Notes (Signed)
Subjective: 3 Days Post-Op Procedure(s) (LRB): OPEN REDUCTION INTERNAL FIXATION (ORIF) DISTAL FEMUR FRACTURE (Right) Patient reports pain as mild this morning.  Patient is well, and has had no acute complaints or problems Denies any CP, SOB, ABD pain. Continue PT today, plan is for discharge to SNF. Patient has had a BM.  Objective: Vital signs in last 24 hours: Temp:  [98 F (36.7 C)-98.6 F (37 C)] 98 F (36.7 C) (11/12 0754) Pulse Rate:  [76-80] 78 (11/12 0754) Resp:  [16-17] 17 (11/12 0754) BP: (123-127)/(61-62) 126/61 (11/12 0754) SpO2:  [98 %-100 %] 100 % (11/12 0754)  Intake/Output from previous day: 11/11 0701 - 11/12 0700 In: 360 [P.O.:360] Out: 1000 [Urine:1000] Intake/Output this shift: No intake/output data recorded.  Recent Labs    08/05/22 0627 08/06/22 0429 08/07/22 0419  HGB 10.1* 9.5* 9.4*   Recent Labs    08/06/22 0429 08/07/22 0419  WBC 7.0 6.7  RBC 3.08* 3.02*  HCT 28.8* 28.8*  PLT 273 276   Recent Labs    08/06/22 0429 08/07/22 0419  NA 139 136  K 3.8 4.4  CL 107 106  CO2 27 27  BUN 12 8  CREATININE 0.76 0.70  GLUCOSE 98 102*  CALCIUM 8.0* 7.8*   No results for input(s): "LABPT", "INR" in the last 72 hours.  EXAM General - Patient is Alert, Appropriate, and Oriented Extremity - Neurovascular intact Sensation intact distally Intact pulses distally Dorsiflexion/Plantar flexion intact Negative homans test to bilateral lower extremities. Dressing - dressing C/D/I and no drainage, knee immobilizer intact to the right leg, knee immobilizer was re-positioned this AM. Motor Function - intact, moving foot and toes well on exam.  Intact bowel sounds this morning, soft to palpation.  Past Medical History:  Diagnosis Date   Anxiety    Depression    High cholesterol    Hypertension    NSTEMI (non-ST elevated myocardial infarction) (HCC)    Pulmonary embolism (HCC) 2001   lung   Sleep apnea     Assessment/Plan:   3 Days Post-Op  Procedure(s) (LRB): OPEN REDUCTION INTERNAL FIXATION (ORIF) DISTAL FEMUR FRACTURE (Right) Principal Problem:   Closed fracture of right distal femur (HCC)  Estimated body mass index is 46 kg/m as calculated from the following:   Height as of this encounter: 5\' 6"  (1.676 m).   Weight as of this encounter: 129.3 kg. Advance diet Up with therapy, nonweightbearing right lower extremity.  Maintain knee immobilizer with ambulation Vital signs are stable Labs are stable, Hg 9.4 Pain well controlled Care management to assist with discharge.  Current plan is for d/c to SNF however if she is able to improve transfers she may be able to d/c home with HHPT. Plan for discharge tomorrow to either SNF or HHPT.  Upon discharge, continue lovenox for two weeks. Follow-up with [facility] in two weeks for skin check and x-rays.  DVT Prophylaxis - Lovenox and SCD's Nonweightbearing right lower extremity  [facility]. [facility], PA-C [facility] 08/07/2022, 9:14 AM

## 2022-08-08 MED ORDER — DIPHENHYDRAMINE HCL 25 MG PO CAPS: 25.0000 mg | ORAL_CAPSULE | Freq: Four times a day (QID) | ORAL | 0 refills | Status: AC | PRN

## 2022-08-08 NOTE — Progress Notes (Addendum)
Physical Therapy Treatment Patient Details Name: Erika Knapp MRN: [id number] DOB: [birth year] Today's Date: 08/08/2022   History of Present Illness Pt is a 56 yo F with a mechanical fall diagnosed with right distal femur periprosthetic fracture and is s/p open reduction internal fixation.  PMH includes: CKD, depression, HTN, PE, and R TKA.    PT Comments    Receptive to session this pm.  Stated she walked to bathroom this morning.  Reviewed NWB orders and pt did report she is putting weight on leg with gait.  Does start crying and encouragement given.  She stated she is independent and does not want to loose that.  Reviewed recommendation for wheelchair to allow her to be so at home and importance on maintaining NWB status to allow for proper healing.  She is able to stand at bedside and keep NWB and shimmy left/right on foot.  Transfer to/from recliner and BSC with good adherence to WB status.  She was receptive and felt better after therapy session.  Discussed with PA.  She continues to do HEP on her own and stated she will not walk until she is able to maintain.   Recommendations for follow up therapy are one component of a multi-disciplinary discharge planning process, led by the attending physician.  Recommendations may be updated based on patient status, additional functional criteria and insurance authorization.  Follow Up Recommendations  Home health PT     Assistance Recommended at Discharge Frequent or constant Supervision/Assistance  Patient can return home with the following Assistance with cooking/housework;Assistance with feeding;Direct supervision/assist for medications management;Direct supervision/assist for financial management;Assist for transportation;Help with stairs or ramp for entrance;A little help with walking and/or transfers;A little help with bathing/dressing/bathroom   Equipment Recommendations  Rolling walker (2 wheels);BSC/3in1;Wheelchair (measurements  PT);Wheelchair cushion (measurements PT) (bariatric sizes)    Recommendations for Other Services       Precautions / Restrictions Precautions Precautions: Fall Required Braces or Orthoses: Knee Immobilizer - Right Knee Immobilizer - Right: On at all times Restrictions Weight Bearing Restrictions: Yes RLE Weight Bearing: Non weight bearing     Mobility  Bed Mobility Overal bed mobility: Modified Independent       Supine to sit: Modified independent (Device/Increase time) Sit to supine: Modified independent (Device/Increase time)        Transfers Overall transfer level: Needs assistance Equipment used: Rolling walker (2 wheels) Transfers: Sit to/from Stand Sit to Stand: From elevated surface, Supervision, Modified independent (Device/Increase time)   Step pivot transfers: Supervision, Modified independent (Device/Increase time)            Ambulation/Gait   Gait Distance (Feet): 3 Feet Assistive device: Rolling walker (2 wheels) Gait Pattern/deviations: Shuffle, Trunk flexed       General Gait Details: Does well with WB status and more receptive to education   Stairs             Wheelchair Mobility    Modified Rankin (Stroke Patients Only)       Balance Overall balance assessment: Needs assistance Sitting-balance support: Bilateral upper extremity supported Sitting balance-Leahy Scale: Normal     Standing balance support: Bilateral upper extremity supported, During functional activity, Reliant on assistive device for balance Standing balance-Leahy Scale: Good Standing balance comment: reliant on RW                            Cognition Arousal/Alertness: Awake/alert Behavior During Therapy: [facility] for tasks assessed/performed Overall Cognitive  Status: Within Functional Limits for tasks assessed                                          Exercises      General Comments        Pertinent Vitals/Pain Pain  Assessment Pain Assessment: Faces Faces Pain Scale: Hurts a little bit Pain Location: RLE Pain Descriptors / Indicators: Sore, Aching Pain Intervention(s): Limited activity within patient's tolerance, Monitored during session, Repositioned    Home Living                          Prior Function            PT Goals (current goals can now be found in the care plan section) Progress towards PT goals: Progressing toward goals    Frequency    BID      PT Plan Current plan remains appropriate    Co-evaluation              AM-PAC PT "6 Clicks" Mobility   Outcome Measure  Help needed turning from your back to your side while in a flat bed without using bedrails?: None Help needed moving from lying on your back to sitting on the side of a flat bed without using bedrails?: None Help needed moving to and from a bed to a chair (including a wheelchair)?: A Little Help needed standing up from a chair using your arms (e.g., wheelchair or bedside chair)?: A Little Help needed to walk in hospital room?: A Little Help needed climbing 3-5 steps with a railing? : Total 6 Click Score: 18    End of Session Equipment Utilized During Treatment: Gait belt Activity Tolerance: Patient tolerated treatment well;Patient limited by fatigue Patient left: in bed;with call bell/phone within reach;with bed alarm set Nurse Communication: Mobility status PT Visit Diagnosis: Unsteadiness on feet (R26.81);Other abnormalities of gait and mobility (R26.89);Pain Pain - Right/Left: Right Pain - part of body: Knee     Time: 1252-1308 PT Time Calculation (min) (ACUTE ONLY): 16 min  Charges:  $Therapeutic Activity: 8-22 mins                   Chesley Noon, PTA 08/08/22, 1:47 PM

## 2022-08-08 NOTE — Progress Notes (Signed)
PT Cancellation Note  Patient Details Name: Erika Knapp MRN: [id number] DOB: 16-May-1966   Cancelled Treatment:    Reason Eval/Treat Not Completed: Fatigue/lethargy limiting ability to participate  Declined first session.  Returned later after she had breakfast and she is asleep and did not awaken for discussion with husband.  He stated she had been up to commode and bathed this am along with doing her HEP.  He stated she remains comfortable with discharge home vs SNF.     Danielle Dess 08/08/2022, 9:58 AM

## 2022-08-08 NOTE — Progress Notes (Signed)
Transition of Care [facility]) - Progression Note    Patient Details  Name: Erika Knapp MRN: [id number] Date of Birth: 1966/07/08  Transition of Care [facility]) CM/SW Contact  Truddie Hidden, RN Phone Number: 08/08/2022, 2:24 PM  Clinical Narrative:    Patient is not able to walk the distance required to go the bathroom, or he/she is unable to safely negotiate stairs required to access the bathroom.  A BSC will alleviate this problem.         Expected Discharge Plan and Services                                                 Social Determinants of Health (SDOH) Interventions    Readmission Risk Interventions     No data to display

## 2022-08-08 NOTE — Care Management Important Message (Signed)
Important Message  Patient Details  Name: Erika Knapp MRN: [id number] Date of Birth: 1966-07-26   Medicare Important Message Given:  Yes     Olegario Messier A [place] Feldpausch 08/08/2022, 2:43 PM

## 2022-08-08 NOTE — Progress Notes (Signed)
   Subjective: 4 Days Post-Op Procedure(s) (LRB): OPEN REDUCTION INTERNAL FIXATION (ORIF) DISTAL FEMUR FRACTURE (Right) Patient reports pain as mild.  Improved with Benadryl. Patient states she has been able to walk from the chair to the bedside commode independently with a walker.  Patient has not participated with physical therapy. Patient is well, and has had no acute complaints or problems Denies any CP, SOB, ABD pain. We will continue with physical therapy today.   Objective: Vital signs in last 24 hours: Temp:  [98 F (36.7 C)-98.2 F (36.8 C)] 98 F (36.7 C) (11/13 0753) Pulse Rate:  [72-89] 72 (11/13 0753) Resp:  [17] 17 (11/12 1555) BP: (119-136)/(55-73) 136/73 (11/13 0753) SpO2:  [99 %-100 %] 99 % (11/13 0753)  Intake/Output from previous day: 11/12 0701 - 11/13 0700 In: 480 [P.O.:480] Out: 300 [Urine:300] Intake/Output this shift: No intake/output data recorded.  Recent Labs    08/06/22 0429 08/07/22 0419  HGB 9.5* 9.4*   Recent Labs    08/06/22 0429 08/07/22 0419  WBC 7.0 6.7  RBC 3.08* 3.02*  HCT 28.8* 28.8*  PLT 273 276   Recent Labs    08/06/22 0429 08/07/22 0419  NA 139 136  K 3.8 4.4  CL 107 106  CO2 27 27  BUN 12 8  CREATININE 0.76 0.70  GLUCOSE 98 102*  CALCIUM 8.0* 7.8*   No results for input(s): "LABPT", "INR" in the last 72 hours.  EXAM General - Patient is Alert, Appropriate, and Oriented Extremity - Neurovascular intact Sensation intact distally Intact pulses distally Dorsiflexion/Plantar flexion intact Dressing - dressing C/D/I and no drainage, knee immobilizer intact Motor Function - intact, moving foot and toes well on exam.   Past Medical History:  Diagnosis Date   Anxiety    Depression    High cholesterol    Hypertension    NSTEMI (non-ST elevated myocardial infarction) (HCC)    Pulmonary embolism (HCC) 2001   lung   Sleep apnea     Assessment/Plan:   4 Days Post-Op Procedure(s) (LRB): OPEN REDUCTION  INTERNAL FIXATION (ORIF) DISTAL FEMUR FRACTURE (Right) Principal Problem:   Closed fracture of right distal femur (HCC)  Estimated body mass index is 46 kg/m as calculated from the following:   Height as of this encounter: 5\' 6"  (1.676 m).   Weight as of this encounter: 129.3 kg. Advance diet Up with therapy, nonweightbearing right lower extremity.  Maintain knee immobilizer with ambulation Vital signs are stable Pain well controlled Care management to assist with discharge to home with home health PT pending progression with physical therapy today.  DVT Prophylaxis - Lovenox and SCD's Nonweightbearing right lower extremity   T. , PA-C [facility] 08/08/2022, 10:55 AM

## 2022-08-08 NOTE — TOC Transition Note (Signed)
Transition of Care [facility]) - CM/SW Discharge Note   Patient Details  Name: Erika Knapp MRN: [id number] Date of Birth: 1966-09-19  [facility]) CM/SW Contact:  Truddie Hidden, RN Phone Number: 08/08/2022, 2:21 PM   Clinical Narrative:    Spoke with patient in room regarding discharge plan. Patient refused SNF. She prefers to return [facility] with [facility]. She will be transported by her husband who is at the bedside.   RW, BSC, and bari WC requested via [employer].   TOC signing off.            Patient Goals and CMS Choice        Discharge Placement                       Discharge Plan and Services                                     Social Determinants of Health (SDOH) Interventions     Readmission Risk Interventions     No data to display

## 2022-08-09 MED ORDER — ENOXAPARIN SODIUM 40 MG/0.4ML IJ SOSY: 40.0000 mg | PREFILLED_SYRINGE | INTRAMUSCULAR | 0 refills | Status: AC

## 2022-08-09 MED ORDER — HYDROCODONE-ACETAMINOPHEN 5-325 MG PO TABS: 1.0000 | ORAL_TABLET | ORAL | 0 refills | Status: AC | PRN

## 2022-08-09 NOTE — Progress Notes (Addendum)
   Subjective: 5 Days Post-Op Procedure(s) (LRB): OPEN REDUCTION INTERNAL FIXATION (ORIF) DISTAL FEMUR FRACTURE (Right) Patient reports pain as mild.  Patient is well, and has had no acute complaints or problems Denies any CP, SOB, ABD pain. We will continue with physical therapy today.  Plan is to discharge home with home health PT today.  Objective: Vital signs in last 24 hours: Temp:  [98.2 F (36.8 C)-98.3 F (36.8 C)] 98.3 F (36.8 C) (11/14 0746) Pulse Rate:  [78-79] 79 (11/13 2349) Resp:  [16] 16 (11/13 2349) BP: (130-150)/(66-80) 130/66 (11/13 2349) SpO2:  [99 %-100 %] 99 % (11/13 2349)  Intake/Output from previous day: No intake/output data recorded. Intake/Output this shift: Total I/O In: 240 [P.O.:240] Out: -   Recent Labs    08/07/22 0419  HGB 9.4*   Recent Labs    08/07/22 0419  WBC 6.7  RBC 3.02*  HCT 28.8*  PLT 276   Recent Labs    08/07/22 0419  NA 136  K 4.4  CL 106  CO2 27  BUN 8  CREATININE 0.70  GLUCOSE 102*  CALCIUM 7.8*   No results for input(s): "LABPT", "INR" in the last 72 hours.  EXAM General - Patient is Alert, Appropriate, and Oriented Extremity - Neurovascular intact Sensation intact distally Intact pulses distally Dorsiflexion/Plantar flexion intact Dressing - dressing C/D/I and no drainage, knee immobilizer intact Motor Function - intact, moving foot and toes well on exam.   Past Medical History:  Diagnosis Date   Anxiety    Depression    High cholesterol    Hypertension    NSTEMI (non-ST elevated myocardial infarction) (HCC)    Pulmonary embolism (HCC) 2001   lung   Sleep apnea     Assessment/Plan:   5 Days Post-Op Procedure(s) (LRB): OPEN REDUCTION INTERNAL FIXATION (ORIF) DISTAL FEMUR FRACTURE (Right) Principal Problem:   Closed fracture of right distal femur (HCC)  Estimated body mass index is 46 kg/m as calculated from the following:   Height as of this encounter: 5\' 6"  (1.676 m).   Weight as of  this encounter: 129.3 kg. Advance diet Up with therapy, nonweightbearing right lower extremity.  Maintain knee immobilizer with ambulation Vital signs are stable Pain well controlled Care management to assist with discharge to home with home health PT today.  DVT Prophylaxis - Lovenox and SCD's Nonweightbearing right lower extremity   T. , PA-C [facility] 08/09/2022, 8:09 AM  Patient seen and examined, agree with above plan.  The patient is doing well status post right distal femur ORIF, no concerns at this time.  Pain is controlled.  Discussed DVT prophylaxis, pain medication use, and safe transition to home.  Discussed importance of maintaining non-weight bearing on the right leg. All questions answered the patient agrees with above plan will go  home after clears PT.   08/11/2022 MD

## 2022-08-09 NOTE — Plan of Care (Signed)

## 2022-08-09 NOTE — Plan of Care (Signed)

## 2023-12-29 NOTE — Progress Notes (Signed)
 No show

## 2024-01-01 ENCOUNTER — Ambulatory Visit: Payer: Self-pay | Admitting: Internal Medicine
